# Patient Record
Sex: Male | Born: 1935 | Hispanic: No | Marital: Married | Smoking: Former smoker
Health system: Southern US, Community
[De-identification: ages and names within clinical notes are randomized; demographics above are authoritative.]

## PROBLEM LIST (undated history)

## (undated) DIAGNOSIS — Z87438 Personal history of other diseases of male genital organs: Secondary | ICD-10-CM

## (undated) DIAGNOSIS — D649 Anemia, unspecified: Secondary | ICD-10-CM

## (undated) DIAGNOSIS — I10 Essential (primary) hypertension: Secondary | ICD-10-CM

## (undated) DIAGNOSIS — K219 Gastro-esophageal reflux disease without esophagitis: Secondary | ICD-10-CM

## (undated) DIAGNOSIS — G20 Parkinson's disease: Secondary | ICD-10-CM

## (undated) DIAGNOSIS — C801 Malignant (primary) neoplasm, unspecified: Secondary | ICD-10-CM

## (undated) DIAGNOSIS — E785 Hyperlipidemia, unspecified: Secondary | ICD-10-CM

## (undated) DIAGNOSIS — K279 Peptic ulcer, site unspecified, unspecified as acute or chronic, without hemorrhage or perforation: Secondary | ICD-10-CM

## (undated) DIAGNOSIS — G20A1 Parkinson's disease without dyskinesia, without mention of fluctuations: Secondary | ICD-10-CM

## (undated) DIAGNOSIS — M1990 Unspecified osteoarthritis, unspecified site: Secondary | ICD-10-CM

## (undated) DIAGNOSIS — Z87442 Personal history of urinary calculi: Secondary | ICD-10-CM

## (undated) HISTORY — PX: EYE SURGERY: SHX253

## (undated) HISTORY — PX: APPENDECTOMY: SHX54

---

## 1997-06-22 HISTORY — PX: HERNIA REPAIR: SHX51

## 2007-11-23 ENCOUNTER — Ambulatory Visit: Payer: Self-pay | Admitting: Urology

## 2007-11-29 ENCOUNTER — Ambulatory Visit: Payer: Self-pay | Admitting: Urology

## 2007-12-01 ENCOUNTER — Ambulatory Visit: Payer: Self-pay | Admitting: Urology

## 2007-12-12 ENCOUNTER — Ambulatory Visit: Payer: Self-pay | Admitting: Urology

## 2008-01-06 ENCOUNTER — Ambulatory Visit: Payer: Self-pay | Admitting: Urology

## 2008-02-10 ENCOUNTER — Ambulatory Visit: Payer: Self-pay | Admitting: Urology

## 2008-06-26 ENCOUNTER — Ambulatory Visit: Payer: Self-pay | Admitting: Ophthalmology

## 2008-06-26 ENCOUNTER — Ambulatory Visit: Payer: Self-pay | Admitting: Internal Medicine

## 2008-07-03 ENCOUNTER — Ambulatory Visit: Payer: Self-pay | Admitting: Ophthalmology

## 2008-08-13 ENCOUNTER — Ambulatory Visit: Payer: Self-pay | Admitting: Urology

## 2008-08-14 ENCOUNTER — Ambulatory Visit: Payer: Self-pay | Admitting: Ophthalmology

## 2008-09-18 ENCOUNTER — Ambulatory Visit: Payer: Self-pay | Admitting: Urology

## 2009-03-18 ENCOUNTER — Ambulatory Visit: Payer: Self-pay | Admitting: Urology

## 2009-09-02 ENCOUNTER — Ambulatory Visit: Payer: Self-pay | Admitting: Urology

## 2010-01-27 ENCOUNTER — Ambulatory Visit: Payer: Self-pay | Admitting: Urology

## 2010-01-30 ENCOUNTER — Ambulatory Visit: Payer: Self-pay | Admitting: Urology

## 2010-02-10 ENCOUNTER — Ambulatory Visit: Payer: Self-pay | Admitting: Urology

## 2010-09-08 ENCOUNTER — Ambulatory Visit: Payer: Self-pay | Admitting: Urology

## 2011-05-01 ENCOUNTER — Ambulatory Visit: Payer: Self-pay | Admitting: Unknown Physician Specialty

## 2011-09-14 ENCOUNTER — Ambulatory Visit: Payer: Self-pay | Admitting: Urology

## 2012-09-12 ENCOUNTER — Ambulatory Visit: Payer: Self-pay | Admitting: Urology

## 2013-07-31 ENCOUNTER — Ambulatory Visit: Payer: Self-pay

## 2013-09-29 ENCOUNTER — Ambulatory Visit: Payer: Self-pay | Admitting: Urology

## 2015-08-19 ENCOUNTER — Encounter: Payer: Self-pay | Admitting: Podiatry

## 2015-08-19 ENCOUNTER — Ambulatory Visit (INDEPENDENT_AMBULATORY_CARE_PROVIDER_SITE_OTHER): Payer: Medicare Other | Admitting: Podiatry

## 2015-08-19 VITALS — BP 154/88 | HR 69 | Resp 12

## 2015-08-19 DIAGNOSIS — L600 Ingrowing nail: Secondary | ICD-10-CM

## 2015-08-19 NOTE — Progress Notes (Signed)
   Subjective:    Patient ID: Barry Pace, male    DOB: December 18, 1935, 80 y.o.   MRN: [id number]  HPI: Barry Pace presents today chief concern discoloration lateral nail border hallux left. He states has been like this for quite some time and does not seem to be growing out. He states that as a matter fact the whole toenails not growing very much.    Review of Systems  Cardiovascular: Positive for leg swelling.  Genitourinary: Positive for frequency.  Skin: Positive for color change.  Hematological: Bruises/bleeds easily.       Objective:   Physical Exam: Vital signs stable alert and oriented 3 pulses are palpable bilateral. Pulses are strongly palpable capillary fill time is normal neurologic sensorium is intact percent joint C monofilament. Deep tendon reflexes are intact bilateral muscle strength +5 over 5 dorsiflexion plantar flexors and inverters everters all to the musculature is intact. Orthopedic evaluation of his result joints distal to the ankle range of motion without crepitation. Cutaneous evaluation demonstrates supple well-hydrated cutis he does demonstrate a subungual hematoma lateral border of the hallux left. I debrided an area of the toenail today to make sure that this was not pigmentation but rather blood. It was blood and does not demonstrate any signs of melanin.        Assessment & Plan:  Subungual hematoma hallux left.  Plan: Debridement of the toenail follow up with me as needed.

## 2016-06-12 ENCOUNTER — Encounter
Admission: RE | Admit: 2016-06-12 | Discharge: 2016-06-12 | Disposition: A | Discharge status: Home / Self Care | Payer: Medicare Other | Source: Ambulatory Visit | Attending: Surgery | Admitting: Surgery

## 2016-06-12 DIAGNOSIS — Z888 Allergy status to other drugs, medicaments and biological substances status: Secondary | ICD-10-CM | POA: Diagnosis not present

## 2016-06-12 DIAGNOSIS — Z01818 Encounter for other preprocedural examination: Secondary | ICD-10-CM | POA: Insufficient documentation

## 2016-06-12 DIAGNOSIS — R9431 Abnormal electrocardiogram [ECG] [EKG]: Secondary | ICD-10-CM | POA: Diagnosis not present

## 2016-06-12 DIAGNOSIS — Z880 Allergy status to penicillin: Secondary | ICD-10-CM | POA: Insufficient documentation

## 2016-06-12 DIAGNOSIS — Z01812 Encounter for preprocedural laboratory examination: Secondary | ICD-10-CM | POA: Insufficient documentation

## 2016-06-12 DIAGNOSIS — K4090 Unilateral inguinal hernia, without obstruction or gangrene, not specified as recurrent: Secondary | ICD-10-CM | POA: Insufficient documentation

## 2016-06-12 HISTORY — DX: Malignant (primary) neoplasm, unspecified: C80.1

## 2016-06-12 HISTORY — DX: Peptic ulcer, site unspecified, unspecified as acute or chronic, without hemorrhage or perforation: K27.9

## 2016-06-12 HISTORY — DX: Personal history of other diseases of male genital organs: Z87.438

## 2016-06-12 HISTORY — DX: Unspecified osteoarthritis, unspecified site: M19.90

## 2016-06-12 HISTORY — DX: Hyperlipidemia, unspecified: E78.5

## 2016-06-12 HISTORY — DX: Gastro-esophageal reflux disease without esophagitis: K21.9

## 2016-06-12 HISTORY — DX: Personal history of urinary calculi: Z87.442

## 2016-06-12 HISTORY — DX: Anemia, unspecified: D64.9

## 2016-06-12 LAB — DIFFERENTIAL
Basophils Absolute: 0 10*3/uL (ref 0–0.1)
Basophils Relative: 0 %
Eosinophils Absolute: 0 10*3/uL (ref 0–0.7)
Eosinophils Relative: 1 %
LYMPHS ABS: 1.5 10*3/uL (ref 1.0–3.6)
LYMPHS PCT: 27 %
MONO ABS: 0.6 10*3/uL (ref 0.2–1.0)
MONOS PCT: 11 %
NEUTROS ABS: 3.4 10*3/uL (ref 1.4–6.5)
Neutrophils Relative %: 61 %

## 2016-06-12 LAB — CBC
HCT: 45.6 % (ref 40.0–52.0)
Hemoglobin: 15.9 g/dL (ref 13.0–18.0)
MCH: 33.1 pg (ref 26.0–34.0)
MCHC: 35 g/dL (ref 32.0–36.0)
MCV: 94.7 fL (ref 80.0–100.0)
PLATELETS: 214 10*3/uL (ref 150–440)
RBC: 4.82 MIL/uL (ref 4.40–5.90)
RDW: 12.6 % (ref 11.5–14.5)
WBC: 5.6 10*3/uL (ref 3.8–10.6)

## 2016-06-12 LAB — SURGICAL PCR SCREEN
MRSA, PCR: NEGATIVE
Staphylococcus aureus: NEGATIVE

## 2016-06-12 NOTE — Patient Instructions (Signed)
  Your procedure is scheduled on: June 19, 2016 (Friday) Report to Same Day Surgery 2nd floor medical mall [facility] Entrance-take elevator on left to 2nd floor.  Check in with surgery information desk.) To find out your arrival time please call [phone] between 1PM - 3PM on June 18, 2016 (Thursday)  Remember: Instructions that are not followed completely may result in serious medical risk, up to and including death, or upon the discretion of your surgeon and anesthesiologist your surgery may need to be rescheduled.    _x___ 1. Do not eat food or drink liquids after midnight. No gum chewing or hard candies.     __x__ 2. No Alcohol for 24 hours before or after surgery.   __x__3. No Smoking for 24 prior to surgery.   ____  4. Bring all medications with you on the day of surgery if instructed.    __x__ 5. Notify your doctor if there is any change in your medical condition     (cold, fever, infections).     Do not wear jewelry, make-up, hairpins, clips or nail polish.  Do not wear lotions, powders, or perfumes. You may wear deodorant.  Do not shave 48 hours prior to surgery. Men may shave face and neck.  Do not bring valuables to the hospital.    [facility] is not responsible for any belongings or valuables.               Contacts, dentures or bridgework may not be worn into surgery.  Leave your suitcase in the car. After surgery it may be brought to your room.  For patients admitted to the hospital, discharge time is determined by your treatment team.   Patients discharged the day of surgery will not be allowed to drive home.  You will need someone to drive you home and stay with you the night of your procedure.    Please read over the following fact sheets that you were given:   [facility] for [facility] and or MRSA Information   _x___ Take these medicines the morning of surgery with A SIP OF WATER:    1. Omeprazole (Omeprazole at bedtime on Thursday  night)  2.  3.  4.  5.  6.  ____Fleets enema or Magnesium Citrate as directed.   _x___ Use CHG Soap or sage wipes as directed on instruction sheet   ____ Use inhalers on the day of surgery and bring to hospital day of surgery  ____ Stop metformin 2 days prior to surgery    ____ Take 1/2 of usual insulin dose the night before surgery and none on the morning of           surgery.   __x__ Stop Aspirin, Coumadin, Pllavix ,Eliquis, Effient, or Pradaxa (NO ASPIRIN)  x__ Stop Anti-inflammatories such as Advil, Aleve, Ibuprofen, Motrin, Naproxen,          Naprosyn, Goodies powders or aspirin products. (Stop Voltaren gel, and Nabumetone now)  Ok to take Tylenol.   _x___ Stop supplements until after surgery.  (Stop Vitamin-C, L-Lysine now)   ____ Bring C-Pap to the hospital.

## 2016-06-19 ENCOUNTER — Encounter: Payer: Self-pay | Admitting: *Deleted

## 2016-06-19 ENCOUNTER — Encounter
Admission: RE | Disposition: A | Discharge status: Home / Self Care | Payer: Self-pay | Source: Ambulatory Visit | Attending: Surgery

## 2016-06-19 ENCOUNTER — Ambulatory Visit: Payer: Medicare Other | Admitting: Certified Registered"

## 2016-06-19 ENCOUNTER — Ambulatory Visit
Admission: RE | Admit: 2016-06-19 | Discharge: 2016-06-19 | Disposition: A | Discharge status: Home / Self Care | Payer: Medicare Other | Source: Ambulatory Visit | Attending: Surgery | Admitting: Surgery

## 2016-06-19 DIAGNOSIS — Z8711 Personal history of peptic ulcer disease: Secondary | ICD-10-CM | POA: Insufficient documentation

## 2016-06-19 DIAGNOSIS — E785 Hyperlipidemia, unspecified: Secondary | ICD-10-CM | POA: Diagnosis not present

## 2016-06-19 DIAGNOSIS — N400 Enlarged prostate without lower urinary tract symptoms: Secondary | ICD-10-CM | POA: Insufficient documentation

## 2016-06-19 DIAGNOSIS — M1990 Unspecified osteoarthritis, unspecified site: Secondary | ICD-10-CM | POA: Insufficient documentation

## 2016-06-19 DIAGNOSIS — K219 Gastro-esophageal reflux disease without esophagitis: Secondary | ICD-10-CM | POA: Diagnosis not present

## 2016-06-19 DIAGNOSIS — Z880 Allergy status to penicillin: Secondary | ICD-10-CM | POA: Diagnosis not present

## 2016-06-19 DIAGNOSIS — K4090 Unilateral inguinal hernia, without obstruction or gangrene, not specified as recurrent: Secondary | ICD-10-CM | POA: Insufficient documentation

## 2016-06-19 DIAGNOSIS — D649 Anemia, unspecified: Secondary | ICD-10-CM | POA: Insufficient documentation

## 2016-06-19 DIAGNOSIS — Z888 Allergy status to other drugs, medicaments and biological substances status: Secondary | ICD-10-CM | POA: Insufficient documentation

## 2016-06-19 HISTORY — PX: INGUINAL HERNIA REPAIR: SHX194

## 2016-06-19 SURGERY — REPAIR, HERNIA, INGUINAL, ADULT
Anesthesia: General | Site: Inguinal | Laterality: Left | Wound class: Clean Contaminated

## 2016-06-19 MED ORDER — ROCURONIUM BROMIDE 100 MG/10ML IV SOLN
INTRAVENOUS | Status: DC | PRN
  Administered 2016-06-19: 40 mg via INTRAVENOUS

## 2016-06-19 MED ORDER — SUGAMMADEX SODIUM 200 MG/2ML IV SOLN
INTRAVENOUS | Status: DC | PRN
  Administered 2016-06-19: 150 mg via INTRAVENOUS

## 2016-06-19 MED ORDER — HYDROCODONE-ACETAMINOPHEN 5-325 MG PO TABS
ORAL_TABLET | ORAL | Status: AC
  Filled 2016-06-19: qty 1

## 2016-06-19 MED ORDER — VANCOMYCIN HCL IN DEXTROSE 1-5 GM/200ML-% IV SOLN
INTRAVENOUS | Status: AC
  Administered 2016-06-19: 1000 mg via INTRAVENOUS
  Filled 2016-06-19: qty 200

## 2016-06-19 MED ORDER — EPINEPHRINE PF 1 MG/ML IJ SOLN
INTRAMUSCULAR | Status: AC
  Filled 2016-06-19: qty 1

## 2016-06-19 MED ORDER — ONDANSETRON HCL 4 MG/2ML IJ SOLN
4.0000 mg | Freq: Once | INTRAMUSCULAR | Status: DC | PRN
Start: 2016-06-19 — End: 2016-06-19

## 2016-06-19 MED ORDER — PHENYLEPHRINE HCL 10 MG/ML IJ SOLN
INTRAMUSCULAR | Status: DC | PRN
  Administered 2016-06-19: 100 ug via INTRAVENOUS
  Administered 2016-06-19 (×2): 200 ug via INTRAVENOUS
  Administered 2016-06-19: 100 ug via INTRAVENOUS
  Administered 2016-06-19: 200 ug via INTRAVENOUS

## 2016-06-19 MED ORDER — ROCURONIUM BROMIDE 50 MG/5ML IV SOSY
PREFILLED_SYRINGE | INTRAVENOUS | Status: AC
  Filled 2016-06-19: qty 5

## 2016-06-19 MED ORDER — BUPIVACAINE-EPINEPHRINE (PF) 0.25% -1:200000 IJ SOLN
INTRAMUSCULAR | Status: DC | PRN
  Administered 2016-06-19: 15 mL via PERINEURAL

## 2016-06-19 MED ORDER — ONDANSETRON HCL 4 MG/2ML IJ SOLN
INTRAMUSCULAR | Status: DC | PRN
  Administered 2016-06-19: 4 mg via INTRAVENOUS

## 2016-06-19 MED ORDER — HYDROCODONE-ACETAMINOPHEN 5-325 MG PO TABS
1.0000 | ORAL_TABLET | ORAL | Status: DC | PRN
  Administered 2016-06-19: 1 via ORAL

## 2016-06-19 MED ORDER — SUGAMMADEX SODIUM 200 MG/2ML IV SOLN
INTRAVENOUS | Status: AC
  Filled 2016-06-19: qty 2

## 2016-06-19 MED ORDER — DEXAMETHASONE SODIUM PHOSPHATE 10 MG/ML IJ SOLN
INTRAMUSCULAR | Status: DC | PRN
  Administered 2016-06-19: 10 mg via INTRAVENOUS

## 2016-06-19 MED ORDER — PROPOFOL 10 MG/ML IV BOLUS
INTRAVENOUS | Status: AC
  Filled 2016-06-19: qty 20

## 2016-06-19 MED ORDER — FENTANYL CITRATE (PF) 100 MCG/2ML IJ SOLN
INTRAMUSCULAR | Status: DC | PRN
  Administered 2016-06-19 (×2): 25 ug via INTRAVENOUS

## 2016-06-19 MED ORDER — FENTANYL CITRATE (PF) 100 MCG/2ML IJ SOLN
25.0000 ug | INTRAMUSCULAR | Status: DC | PRN
  Administered 2016-06-19 (×2): 25 ug via INTRAVENOUS

## 2016-06-19 MED ORDER — VANCOMYCIN HCL IN DEXTROSE 1-5 GM/200ML-% IV SOLN
1000.0000 mg | Freq: Once | INTRAVENOUS | Status: AC
  Administered 2016-06-19: 1000 mg via INTRAVENOUS

## 2016-06-19 MED ORDER — MIDAZOLAM HCL 2 MG/2ML IJ SOLN
INTRAMUSCULAR | Status: DC | PRN
  Administered 2016-06-19: 1 mg via INTRAVENOUS

## 2016-06-19 MED ORDER — PROPOFOL 10 MG/ML IV BOLUS
INTRAVENOUS | Status: DC | PRN
  Administered 2016-06-19: 100 mg via INTRAVENOUS
  Administered 2016-06-19: 40 mg via INTRAVENOUS

## 2016-06-19 MED ORDER — BUPIVACAINE HCL (PF) 0.5 % IJ SOLN
INTRAMUSCULAR | Status: AC
  Filled 2016-06-19: qty 30

## 2016-06-19 MED ORDER — FENTANYL CITRATE (PF) 100 MCG/2ML IJ SOLN
INTRAMUSCULAR | Status: AC
Start: 2016-06-19 — End: 2016-06-19
  Filled 2016-06-19: qty 2

## 2016-06-19 MED ORDER — ONDANSETRON HCL 4 MG/2ML IJ SOLN
INTRAMUSCULAR | Status: AC
  Filled 2016-06-19: qty 2

## 2016-06-19 MED ORDER — HYDROCODONE-ACETAMINOPHEN 5-325 MG PO TABS: 1.0000 | ORAL_TABLET | ORAL | 0 refills | Status: DC | PRN

## 2016-06-19 MED ORDER — LACTATED RINGERS IV SOLN
INTRAVENOUS | Status: DC
  Administered 2016-06-19 (×2): via INTRAVENOUS

## 2016-06-19 MED ORDER — PHENYLEPHRINE HCL 10 MG/ML IJ SOLN
INTRAMUSCULAR | Status: AC
  Filled 2016-06-19: qty 1

## 2016-06-19 MED ORDER — LIDOCAINE HCL (CARDIAC) 20 MG/ML IV SOLN
INTRAVENOUS | Status: DC | PRN
  Administered 2016-06-19: 100 mg via INTRAVENOUS

## 2016-06-19 MED ORDER — DEXAMETHASONE SODIUM PHOSPHATE 10 MG/ML IJ SOLN
INTRAMUSCULAR | Status: AC
  Filled 2016-06-19: qty 1

## 2016-06-19 MED ORDER — FENTANYL CITRATE (PF) 100 MCG/2ML IJ SOLN
INTRAMUSCULAR | Status: AC
  Filled 2016-06-19: qty 2

## 2016-06-19 MED ORDER — MIDAZOLAM HCL 2 MG/2ML IJ SOLN
INTRAMUSCULAR | Status: AC
  Filled 2016-06-19: qty 2

## 2016-06-19 MED ORDER — LIDOCAINE 2% (20 MG/ML) 5 ML SYRINGE
INTRAMUSCULAR | Status: AC
  Filled 2016-06-19: qty 5

## 2016-06-19 MED ORDER — BUPIVACAINE-EPINEPHRINE (PF) 0.25% -1:200000 IJ SOLN
INTRAMUSCULAR | Status: AC
  Filled 2016-06-19: qty 30

## 2016-06-19 SURGICAL SUPPLY — 30 items
BLADE CLIPPER SURG (BLADE) ×3 IMPLANT
BLADE SURG 15 STRL LF DISP TIS (BLADE) ×1 IMPLANT
BLADE SURG 15 STRL SS (BLADE) ×2
CANISTER SUCT 1200ML W/VALVE (MISCELLANEOUS) ×3 IMPLANT
CHLORAPREP W/TINT 26ML (MISCELLANEOUS) ×3 IMPLANT
DERMABOND ADVANCED (GAUZE/BANDAGES/DRESSINGS) ×2
DERMABOND ADVANCED .7 DNX12 (GAUZE/BANDAGES/DRESSINGS) ×1 IMPLANT
DRAIN PENROSE 5/8X18 LTX STRL (WOUND CARE) ×3 IMPLANT
DRAPE LAPAROTOMY 77X122 PED (DRAPES) ×3 IMPLANT
ELECT REM PT RETURN 9FT ADLT (ELECTROSURGICAL) ×3
ELECTRODE REM PT RTRN 9FT ADLT (ELECTROSURGICAL) ×1 IMPLANT
GLOVE BIO SURGEON STRL SZ7.5 (GLOVE) ×6 IMPLANT
GLOVE BIOGEL PI IND STRL 6.5 (GLOVE) ×1 IMPLANT
GLOVE BIOGEL PI INDICATOR 6.5 (GLOVE) ×2
GLOVE SURG SYN 6.5 ES PF (GLOVE) ×3 IMPLANT
GOWN STRL REUS W/ TWL LRG LVL3 (GOWN DISPOSABLE) ×3 IMPLANT
GOWN STRL REUS W/TWL LRG LVL3 (GOWN DISPOSABLE) ×6
KIT RM TURNOVER STRD PROC AR (KITS) ×3 IMPLANT
LABEL OR SOLS (LABEL) IMPLANT
MESH SYNTHETIC 4X6 SOFT BARD (Mesh General) ×1 IMPLANT
MESH SYNTHETIC SOFT BARD 4X6 (Mesh General) ×2 IMPLANT
NEEDLE HYPO 25X1 1.5 SAFETY (NEEDLE) ×3 IMPLANT
NS IRRIG 500ML POUR BTL (IV SOLUTION) ×3 IMPLANT
PACK BASIN MINOR ARMC (MISCELLANEOUS) ×3 IMPLANT
SUT CHROMIC 4 0 RB 1X27 (SUTURE) ×3 IMPLANT
SUT MNCRL AB 4-0 PS2 18 (SUTURE) ×3 IMPLANT
SUT SURGILON 0 30 BLK (SUTURE) ×9 IMPLANT
SUT VIC AB 4-0 SH 27 (SUTURE) ×2
SUT VIC AB 4-0 SH 27XANBCTRL (SUTURE) ×1 IMPLANT
SYRINGE 10CC LL (SYRINGE) ×3 IMPLANT

## 2016-06-19 NOTE — Anesthesia Preprocedure Evaluation (Addendum)
Anesthesia Evaluation  Patient identified by MRN, date of birth, ID band Patient awake    Reviewed: Allergy & Precautions, NPO status , Patient's Chart, lab work & pertinent test results  Airway Mallampati: II  TM Distance: >3 FB     Dental  (+) Chipped   Pulmonary neg pulmonary ROS,    Pulmonary exam normal        Cardiovascular negative cardio ROS Normal cardiovascular exam     Neuro/Psych negative neurological ROS  negative psych ROS   GI/Hepatic Neg liver ROS, PUD, GERD  Medicated and Controlled,  Endo/Other    Renal/GU stones  negative genitourinary   Musculoskeletal  (+) Arthritis , Osteoarthritis,    Abdominal Normal abdominal exam  (+)   Peds negative pediatric ROS (+)  Hematology  (+) anemia ,   Anesthesia Other Findings   Reproductive/Obstetrics                            Anesthesia Physical Anesthesia Plan  ASA: II  Anesthesia Plan: General   Post-op Pain Management:    Induction: Intravenous  Airway Management Planned: Oral ETT  Additional Equipment:   Intra-op Plan:   Post-operative Plan: Extubation in OR  Informed Consent: I have reviewed the patients History and Physical, chart, labs and discussed the procedure including the risks, benefits and alternatives for the proposed anesthesia with the patient or authorized representative who has indicated his/her understanding and acceptance.   Dental advisory given  Plan Discussed with: CRNA and Surgeon  Anesthesia Plan Comments:         Anesthesia Quick Evaluation

## 2016-06-19 NOTE — Op Note (Signed)
OPERATIVE REPORT  PREOPERATIVE DIAGNOSIS: left inguinal hernia  POSTOPERATIVE DIAGNOSIS:left  inguinal hernia  PROCEDURE:  left inguinal hernia repair  ANESTHESIA:  General  SURGEON:  Rochel Brome M.D.  INDICATIONS: He has had recent bulging in the left groin and a left and the hernia was demonstrated on physical exam. Repair is recommended for definitive treatment  With the patient on the operating table in the supine position the left lower quadrant was prepared with clippers and with ChloraPrep and draped in a sterile manner. A transversely oriented suprapubic incision was made and carried down through subcutaneous tissues. Electrocautery was used for hemostasis. The Scarpa's fascia was incised. The external oblique aponeurosis was incised along the course of its fibers to open the external ring and expose the inguinal cord structures. The cord structures were mobilized. A Penrose drain was passed around the cord structures for traction. Cremaster fibers were separated to examine the cord structures and salt no indirect hernia. There was a large direct inguinal hernia in the floor of the inguinal canal. The attenuated transversalis fascia was incised circumferentially and removed. The sac some 5 cm in length was inverted. The repair was carried out with interrupted 0 Surgilon sutures suturing the conjoined tendon to the shelving edge of the inguinal ligament incorporating transversalis fascia into the repair.Bard soft mesh was cut to create an oval shape and was placed over the repair. This was sutured to the repair with interrupted 0 Surgilon sutures and also sutured medially to the deep fascia and on both sides of the internal ring. Next after seeing hemostasis was intact the cord structures were replaced along the floor of the inguinal canal. The cut edges of the external oblique aponeurosis were closed with a running 4-0 Vicryl suture to re-create the external ring. The deep fascia superior and  lateral to the repair site was infiltrated with quarter percent Sensorcaine with epinephrine. Subcutaneous tissues were also infiltrated. The Scarpa's fascia was closed with interrupted 4-0 Vicryl sutures. The skin was closed with running 4-0 Monocryl subcuticular suture and LiquiBand. The testicle remained in the scrotum  The patient appeared to be in satisfactory condition and was prepared for transfer to the recovery room.  Rochel Brome M.D.

## 2016-06-19 NOTE — Anesthesia Procedure Notes (Signed)
Procedure Name: Intubation Date/Time: 06/19/2016 1:03 PM Performed by: Silvana Newness Pre-anesthesia Checklist: Patient identified, Emergency Drugs available, Suction available, Patient being monitored and Timeout performed Patient Re-evaluated:Patient Re-evaluated prior to inductionOxygen Delivery Method: Circle system utilized Preoxygenation: Pre-oxygenation with 100% oxygen Intubation Type: IV induction Ventilation: Mask ventilation without difficulty Laryngoscope Size: Mac and 4 Grade View: Grade I Tube type: Oral Tube size: 7.5 mm Number of attempts: 1 Airway Equipment and Method: Stylet Placement Confirmation: ETT inserted through vocal cords under direct vision,  positive ETCO2 and breath sounds checked- equal and bilateral Secured at: 22 cm Tube secured with: Tape Dental Injury: Teeth and Oropharynx as per pre-operative assessment

## 2016-06-19 NOTE — H&P (Signed)
  He reports no change in condition since the day of the office exam.  Lab work was noted.  The left side was marked YES. I discussed the plan for left inguinal hernia repair

## 2016-06-19 NOTE — Discharge Instructions (Addendum)
Take Tylenol or Norco if needed for pain. ° °Should not drive or do anything dangerous when taking Norco. ° °May shower and blot dry. ° °Avoid straining and heavy lifting. ° °AMBULATORY SURGERY  °DISCHARGE INSTRUCTIONS ° ° °1) The drugs that you were given will stay in your system until tomorrow so for the next 24 hours you should not: ° °A) Drive an automobile °B) Make any legal decisions °C) Drink any alcoholic beverage ° ° °2) You may resume regular meals tomorrow.  Today it is better to start with liquids and gradually work up to solid foods. ° °You may eat anything you prefer, but it is better to start with liquids, then soup and crackers, and gradually work up to solid foods. ° ° °3) Please notify your doctor immediately if you have any unusual bleeding, trouble breathing, redness and pain at the surgery site, drainage, fever, or pain not relieved by medication. ° °4) Additional Instructions: ° ° °Please contact your physician with any problems or Same Day Surgery at [phone], Monday through Friday 6 am to 4 pm, or [place] [facility] number at [phone]. °

## 2016-06-19 NOTE — Transfer of Care (Signed)
Immediate Anesthesia Transfer of Care Note  Patient: Barry Pace  Procedure(s) Performed: Procedure(s): HERNIA REPAIR INGUINAL ADULT (Left)  Patient Location: PACU  Anesthesia Type:General  Level of Consciousness: awake, oriented and patient cooperative  Airway & Oxygen Therapy: Patient Spontanous Breathing and Patient connected to face mask oxygen  Post-op Assessment: Report given to RN, Post -op Vital signs reviewed and stable and Patient moving all extremities X 4  Post vital signs: Reviewed and stable  Last Vitals:  Vitals:   06/19/16 1048  BP: (!) 179/103  Pulse: 88  Resp: 20  Temp: 36.2 C    Last Pain:  Vitals:   06/19/16 1048  TempSrc: Tympanic         Complications: No apparent anesthesia complications

## 2016-06-21 NOTE — Anesthesia Postprocedure Evaluation (Signed)
Anesthesia Post Note  Patient: Barry Pace  Procedure(s) Performed: Procedure(s) (LRB): HERNIA REPAIR INGUINAL ADULT (Left)  Patient location during evaluation: PACU Anesthesia Type: General Level of consciousness: awake and alert and oriented Pain management: pain level controlled Vital Signs Assessment: post-procedure vital signs reviewed and stable Respiratory status: spontaneous breathing Cardiovascular status: blood pressure returned to baseline Anesthetic complications: no     Last Vitals:  Vitals:   06/19/16 1521 06/19/16 1725  BP: (!) 165/86 (!) 151/85  Pulse: 89 78  Resp: 16 16  Temp:  36.8 C    Last Pain:  Vitals:   06/19/16 1725  TempSrc: Oral  PainSc: 0-No pain                 P[place]idon Pam

## 2016-06-23 ENCOUNTER — Encounter: Payer: Self-pay | Admitting: Surgery

## 2016-07-06 ENCOUNTER — Emergency Department
Admission: EM | Admit: 2016-07-06 | Discharge: 2016-07-06 | Disposition: A | Discharge status: Home / Self Care | Payer: Medicare Other | Attending: Emergency Medicine | Admitting: Emergency Medicine

## 2016-07-06 ENCOUNTER — Encounter: Payer: Self-pay | Admitting: *Deleted

## 2016-07-06 ENCOUNTER — Emergency Department: Payer: Medicare Other

## 2016-07-06 DIAGNOSIS — Y999 Unspecified external cause status: Secondary | ICD-10-CM | POA: Insufficient documentation

## 2016-07-06 DIAGNOSIS — Y92009 Unspecified place in unspecified non-institutional (private) residence as the place of occurrence of the external cause: Secondary | ICD-10-CM | POA: Diagnosis not present

## 2016-07-06 DIAGNOSIS — S0993XA Unspecified injury of face, initial encounter: Secondary | ICD-10-CM | POA: Diagnosis present

## 2016-07-06 DIAGNOSIS — S0083XA Contusion of other part of head, initial encounter: Secondary | ICD-10-CM

## 2016-07-06 DIAGNOSIS — W19XXXA Unspecified fall, initial encounter: Secondary | ICD-10-CM

## 2016-07-06 DIAGNOSIS — S0181XA Laceration without foreign body of other part of head, initial encounter: Secondary | ICD-10-CM | POA: Insufficient documentation

## 2016-07-06 DIAGNOSIS — Y9301 Activity, walking, marching and hiking: Secondary | ICD-10-CM | POA: Diagnosis not present

## 2016-07-06 DIAGNOSIS — S01111A Laceration without foreign body of right eyelid and periocular area, initial encounter: Secondary | ICD-10-CM | POA: Diagnosis not present

## 2016-07-06 DIAGNOSIS — T148XXA Other injury of unspecified body region, initial encounter: Secondary | ICD-10-CM

## 2016-07-06 DIAGNOSIS — W1839XA Other fall on same level, initial encounter: Secondary | ICD-10-CM | POA: Diagnosis not present

## 2016-07-06 MED ORDER — LIDOCAINE HCL (PF) 1 % IJ SOLN
5.0000 mL | Freq: Once | INTRAMUSCULAR | Status: DC
  Filled 2016-07-06: qty 5

## 2016-07-06 MED ORDER — BACITRACIN ZINC 500 UNIT/GM EX OINT
TOPICAL_OINTMENT | Freq: Once | CUTANEOUS | Status: AC
  Administered 2016-07-06: 1 via TOPICAL
  Filled 2016-07-06: qty 0.9

## 2016-07-06 MED ORDER — LIDOCAINE-EPINEPHRINE-TETRACAINE (LET) SOLUTION
3.0000 mL | Freq: Once | NASAL | Status: AC
  Administered 2016-07-06: 20:00:00 3 mL via TOPICAL
  Filled 2016-07-06: qty 3

## 2016-07-06 MED ORDER — BACITRACIN ZINC 500 UNIT/GM EX OINT
TOPICAL_OINTMENT | CUTANEOUS | Status: AC
  Filled 2016-07-06: qty 3.6

## 2016-07-06 NOTE — ED Notes (Signed)
See triage note   States he tripped from curb  Landed face first. Has multiple abrasions to forehead,chin  And hand  Denies any LOC

## 2016-07-06 NOTE — ED Triage Notes (Signed)
Pt tripped off curb at PCP office (there for hernia follow up) and arrives with abrasions to forehead, chin, and right hand, denies any use of blood thinners

## 2016-07-06 NOTE — Discharge Instructions (Signed)
Your exam, CT scan, and x-ray are normal after your fall. Keep the abrasions clean and covered with antibiotic ointment. See your provider in 3-5 days for suture removal. Follow-up with your surgeon for any concerns to your surgical site.

## 2016-07-06 NOTE — ED Provider Notes (Signed)
[facility] Emergency Department Provider Note ____________________________________________  Time seen: 1745  I have reviewed the triage vital signs and the nursing notes.  HISTORY  Chief Complaint  Fall  HPI Barry Pace is a 81 y.o. male presents to the ED for evaluation of injuries sustained after a fall walking into the house. He reports missing the curb and falling forward onto his face. He sustained abrasions, lacerations and bruising to the right side of the face and chin. He also has some pain to the right middle finger and abrasions to the right knee. He had just left the surgeon's office for a 2-week post op check of a RIH repair. He denies any LOC, nausea, vomiting, vision change, or abdominal pain. He reports no wound dehiscence.   Past Medical History:  Diagnosis Date  . Anemia   . Arthritis   . Cancer (HCC)    Pre-cancerous skin cells  . GERD (gastroesophageal reflux disease)   . History of BPH   . History of kidney stones   . Hyperlipidemia   . PUD (peptic ulcer disease)     There are no active problems to display for this patient.   Past Surgical History:  Procedure Laterality Date  . APPENDECTOMY    . EYE SURGERY Bilateral    Cataract Extraction with IOL  . HERNIA REPAIR Right 1999   Inguinal Hernia Repair  . INGUINAL HERNIA REPAIR Left 06/19/2016   Procedure: HERNIA REPAIR INGUINAL ADULT;  Surgeon: Leonie Green, MD;  Location: [facility] ORS;  Service: General;  Laterality: Left;    Prior to Admission medications   Medication Sig Start Date End Date Taking? Authorizing Provider  carboxymethylcellulose 1 % ophthalmic solution Apply 1 drop to eye 3 (three) times daily as needed (for dry/itchy eyes). Thera Tears.    Historical Provider, MD  desonide (DESOWEN) 0.05 % cream Apply 1 application topically 2 (two) times daily as needed (for dry/scaly areas in scalp).     Historical Provider, MD  fluticasone (FLONASE) 50 MCG/ACT nasal  spray Place 1 spray into both nostrils 2 (two) times daily as needed (for cold/allergy symptoms.).    Historical Provider, MD  folic acid (FOLVITE) A999333 MCG tablet Take 400 mcg by mouth daily.     Historical Provider, MD  HYDROcodone-acetaminophen (NORCO) 5-325 MG tablet Take 1-2 tablets by mouth every 4 (four) hours as needed for moderate pain. 06/19/16   Leonie Green, MD  L-Lysine 500 MG CAPS Take 500 mg by mouth daily as needed (for fever blisters/cold sores.).    Historical Provider, MD  loratadine (CLARITIN) 10 MG tablet Take 10 mg by mouth daily as needed (for upper respiratory issues).    Historical Provider, MD  nabumetone (RELAFEN) 500 MG tablet Take 500 mg by mouth 2 (two) times daily.  05/31/15   Historical Provider, MD  omeprazole (PRILOSEC) 20 MG capsule Take 20 mg by mouth daily before breakfast.  03/28/15   Historical Provider, MD  potassium citrate (UROCIT-K) 10 MEQ (1080 MG) SR tablet Take 10 mEq by mouth 2 (two) times daily.     Historical Provider, MD  vitamin C (ASCORBIC ACID) 500 MG tablet Take 500 mg by mouth daily.     Historical Provider, MD  VOLTAREN 1 % GEL Apply 1 application topically 4 (four) times daily as needed. For pain/inflammation 05/21/16   Historical Provider, MD    Allergies Atorvastatin; Escitalopram; Ezetimibe-simvastatin; Penicillins; and Rosuvastatin  History reviewed. No pertinent family history.  Social History  Social History  Substance Use Topics  . Smoking status: Never Smoker  . Smokeless tobacco: Never Used  . Alcohol use 0.0 oz/week     Comment: occassional    Review of Systems  Constitutional: Negative for fever. Eyes: Negative for visual changes. ENT: Negative for sore throat. Cardiovascular: Negative for chest pain. Respiratory: Negative for shortness of breath. Gastrointestinal: Negative for abdominal pain, vomiting and diarrhea. Genitourinary: Negative for dysuria. Musculoskeletal: Negative for back pain. Skin: Negative for  rash. Neurological: Negative for headaches, focal weakness or numbness. ____________________________________________  PHYSICAL EXAM:  VITAL SIGNS: ED Triage Vitals [07/06/16 1716]  Enc Vitals Group     BP (!) 171/94     Pulse Rate 84     Resp 18     Temp 98 F (36.7 C)     Temp Source Oral     SpO2 97 %     Weight 171 lb (77.6 kg)     Height 5\' 9"  (1.753 m)     Head Circumference      Peak Flow      Pain Score 0     Pain Loc      Pain Edu?      Excl. in [place]?     Constitutional: Alert and oriented. Well appearing and in no distress. Head: Normocephalic and atraumatic, except for superficial abrasions to the nose bridge, right side of the forehead, cheek, and chin. Laceration to the right brow medially. Linear laceration to the right zygomatic cheek.  Eyes: Conjunctivae are normal. PERRL. Normal extraocular movements Ears: Canals clear. TMs intact bilaterally. Nose: No congestion/rhinorrhea/epistaxis. Mouth/Throat: Mucous membranes are moist. No dental injury Neck: Supple. No thyromegaly. Cardiovascular: Normal rate, regular rhythm. Normal distal pulses. Respiratory: Normal respiratory effort. No wheezes/rales/rhonchi. Gastrointestinal: Soft and nontender. No distention. Musculoskeletal: right hand with normal composite fist. Right middle finger with DIP tenderness without dislocation, edema, or deformity. Nontender with normal range of motion in all extremities.  Neurologic:  Normal gait without ataxia. Normal speech and language. No gross focal neurologic deficits are appreciated. Skin:  Skin is warm, dry and intact. No rash noted. RIH surgical wound site without erythema, induration, or dehiscence.  Psychiatric: Mood and affect are normal. Patient exhibits appropriate insight and judgment. ____________________________________________   RADIOLOGY  CT Maxilofacial  IMPRESSION: No acute osseous abnormality. Clear paranasal sinuses and mastoids. Soft tissue abrasions of  the right cheek.  Right Middle Finger  IMPRESSION: No acute osseous abnormality. Dorsal spurring off the dorsum of the third distal phalanx. Degenerative joint space narrowing of the PIP and DIP joints of the third and included fourth digits.  I, Aneeka Bowden, Dannielle Karvonen, personally viewed and evaluated these images (plain radiographs) as part of my medical decision making, as well as reviewing the written report by the radiologist. ____________________________________________  PROCEDURES Bacitracin ointment to facial abrasions  LACERATION REPAIR Performed by: Melvenia Needles Authorized by: Melvenia Needles Consent: Verbal consent obtained. Risks and benefits: risks, benefits and alternatives were discussed Consent given by: patient Patient identity confirmed: provided demographic data Prepped and Draped in normal sterile fashion Wound explored  Laceration Location: right brow & cheek  Laceration Length: brow 1 cm &  Cheek 1.5 cm   No Foreign Bodies seen or palpated  Anesthesia: topical infiltration  Local anesthetic: lidocaine-epinephrine-tetracaine  Anesthetic total: 3 ml  Irrigation method: syringe Amount of cleaning: standard  Skin closure: 5-0 nylon  Number of sutures: brow - #2 & cheek - #3  Technique:  interrupted  Patient tolerance: Patient tolerated the procedure well with no immediate complications. ____________________________________________  INITIAL IMPRESSION / ASSESSMENT AND PLAN / ED COURSE  Patient with facial abrasions and lacerations s/p a mechanical fall. Facial lacerations s/p suture repair. Patient's CT and x-ray negative for acute fracture or dislocation. Patient is discharged to follow-up with his primary care provider in 3-5 days for suture removal. He will follow-up with his surgeon for any surgery site concerns.   Clinical Course    ____________________________________________  FINAL CLINICAL IMPRESSION(S) / ED  DIAGNOSES  Final diagnoses:  Fall, initial encounter  Contusion of face, initial encounter  Abrasion  Facial laceration, initial encounter     Melvenia Needles, PA-C 07/06/16 2110    Harvest Dark, MD 07/06/16 2257

## 2017-09-20 ENCOUNTER — Telehealth: Payer: Self-pay | Admitting: Urology

## 2017-09-20 NOTE — Telephone Encounter (Signed)
Pt is a transfer from [facility] w/[place] and needs his preferred pharmacy updated to [facility] on [address].

## 2017-09-23 NOTE — Telephone Encounter (Signed)
Pharmacy updated.

## 2017-10-18 ENCOUNTER — Ambulatory Visit: Payer: Self-pay | Admitting: Urology

## 2017-10-19 ENCOUNTER — Ambulatory Visit: Payer: Self-pay | Admitting: Urology

## 2017-11-02 ENCOUNTER — Emergency Department
Admission: EM | Admit: 2017-11-02 | Discharge: 2017-11-02 | Disposition: A | Discharge status: Home / Self Care | Payer: Medicare Other | Attending: Emergency Medicine | Admitting: Emergency Medicine

## 2017-11-02 ENCOUNTER — Telehealth: Payer: Self-pay

## 2017-11-02 ENCOUNTER — Emergency Department: Payer: Medicare Other

## 2017-11-02 ENCOUNTER — Other Ambulatory Visit: Payer: Self-pay

## 2017-11-02 ENCOUNTER — Encounter: Payer: Self-pay | Admitting: Urology

## 2017-11-02 ENCOUNTER — Encounter: Payer: Self-pay | Admitting: Emergency Medicine

## 2017-11-02 ENCOUNTER — Ambulatory Visit (INDEPENDENT_AMBULATORY_CARE_PROVIDER_SITE_OTHER): Payer: Medicare Other | Admitting: Urology

## 2017-11-02 VITALS — BP 190/95 | HR 85 | Resp 16 | Ht 69.0 in | Wt 169.8 lb

## 2017-11-02 DIAGNOSIS — N200 Calculus of kidney: Secondary | ICD-10-CM

## 2017-11-02 DIAGNOSIS — R0602 Shortness of breath: Secondary | ICD-10-CM | POA: Insufficient documentation

## 2017-11-02 DIAGNOSIS — I2510 Atherosclerotic heart disease of native coronary artery without angina pectoris: Secondary | ICD-10-CM

## 2017-11-02 DIAGNOSIS — Z79899 Other long term (current) drug therapy: Secondary | ICD-10-CM | POA: Insufficient documentation

## 2017-11-02 DIAGNOSIS — R55 Syncope and collapse: Secondary | ICD-10-CM | POA: Diagnosis present

## 2017-11-02 DIAGNOSIS — R109 Unspecified abdominal pain: Secondary | ICD-10-CM | POA: Diagnosis not present

## 2017-11-02 LAB — URINALYSIS, COMPLETE (UACMP) WITH MICROSCOPIC
Bacteria, UA: NONE SEEN
Bilirubin Urine: NEGATIVE
GLUCOSE, UA: NEGATIVE mg/dL
Hgb urine dipstick: NEGATIVE
Ketones, ur: NEGATIVE mg/dL
Leukocytes, UA: NEGATIVE
NITRITE: NEGATIVE
Protein, ur: NEGATIVE mg/dL
Specific Gravity, Urine: 1.013 (ref 1.005–1.030)
pH: 8 (ref 5.0–8.0)

## 2017-11-02 LAB — CBC
HCT: 43.8 % (ref 40.0–52.0)
HEMOGLOBIN: 15.6 g/dL (ref 13.0–18.0)
MCH: 34.1 pg — AB (ref 26.0–34.0)
MCHC: 35.7 g/dL (ref 32.0–36.0)
MCV: 95.6 fL (ref 80.0–100.0)
Platelets: 238 10*3/uL (ref 150–440)
RBC: 4.58 MIL/uL (ref 4.40–5.90)
RDW: 13 % (ref 11.5–14.5)
WBC: 6.6 10*3/uL (ref 3.8–10.6)

## 2017-11-02 LAB — BASIC METABOLIC PANEL
Anion gap: 10 (ref 5–15)
BUN: 21 mg/dL — AB (ref 6–20)
CALCIUM: 9.2 mg/dL (ref 8.9–10.3)
CO2: 24 mmol/L (ref 22–32)
CREATININE: 1.52 mg/dL — AB (ref 0.61–1.24)
Chloride: 104 mmol/L (ref 101–111)
GFR calc Af Amer: 48 mL/min — ABNORMAL LOW (ref >60)
GFR, EST NON AFRICAN AMERICAN: 42 mL/min — AB (ref >60)
GLUCOSE: 122 mg/dL — AB (ref 65–99)
Potassium: 3.8 mmol/L (ref 3.5–5.1)
Sodium: 138 mmol/L (ref 135–145)

## 2017-11-02 LAB — TROPONIN I
Troponin I: <0.03 ng/mL (ref <0.03)
Troponin I: <0.03 ng/mL (ref <0.03)

## 2017-11-02 MED ORDER — ONDANSETRON HCL 4 MG/2ML IJ SOLN
4.0000 mg | Freq: Once | INTRAMUSCULAR | Status: AC
  Administered 2017-11-02: 4 mg via INTRAVENOUS
  Filled 2017-11-02: qty 2

## 2017-11-02 MED ORDER — IOPAMIDOL (ISOVUE-370) INJECTION 76%
75.0000 mL | Freq: Once | INTRAVENOUS | Status: AC | PRN
  Administered 2017-11-02: 75 mL via INTRAVENOUS

## 2017-11-02 MED ORDER — SODIUM CHLORIDE 0.9 % IV BOLUS
1000.0000 mL | Freq: Once | INTRAVENOUS | Status: AC
  Administered 2017-11-02: 1000 mL via INTRAVENOUS

## 2017-11-02 NOTE — Telephone Encounter (Signed)
Called pt's wife, Enid Derry listed as emergency contact. Advised pt's wife that the patient was being transported by [place] to the ED as he was experiencing elevated BPs, nausea and dizziness. Pt's wife gave verbal understanding.

## 2017-11-02 NOTE — Discharge Instructions (Addendum)
Your tests today overall were unremarkable and did not reveal a cause for your symptoms today.  There is evidence of extensive coronary artery disease, and you should follow up with cardiology for further evaluation of this finding.

## 2017-11-02 NOTE — ED Notes (Signed)
Report given to Andrea RN

## 2017-11-02 NOTE — ED Notes (Signed)
MRI called to say they are coming to get pt for his scan.

## 2017-11-02 NOTE — ED Notes (Signed)
Patient transported to CT 

## 2017-11-02 NOTE — ED Notes (Signed)
The EKG was completed and signed by Dr. Joni Fears. The EKG was also exported into the system.

## 2017-11-02 NOTE — ED Notes (Signed)
Pt returned from MRI and placed on monitor. Alert and talkative with no distress noted. Family at the bedside.

## 2017-11-02 NOTE — ED Provider Notes (Signed)
[facility] Emergency Department Provider Note  ____________________________________________  Time seen: Approximately 5:18 PM  I have reviewed the triage vital signs and the nursing notes.   HISTORY  Chief Complaint Near Syncope and Shortness of Breath    HPI Barry Pace is a 82 y.o. male who complains of sudden onset of dizziness described as room spinning, starting at about 3:30 PM today while the patient was in the urology clinic waiting to see his doctor.. Associated with nausea and mild bilateral frontal headache and shortness of breath that all started at the same time. Constant, severe. Worse with turning head or moving eyes. No alleviating factors. Denies chest pain abdominal pain or back pain. Symptoms are still present at this time. Headache is dull and nonradiating.  He reports having a history of intermittent vertiginous symptoms over the past 2 weeks but otherwise no prior symptoms like today. No known history of aneurysms.      Past Medical History:  Diagnosis Date  . Anemia   . Arthritis   . Cancer (HCC)    Pre-cancerous skin cells  . GERD (gastroesophageal reflux disease)   . History of BPH   . History of kidney stones   . Hyperlipidemia   . PUD (peptic ulcer disease)      There are no active problems to display for this patient.    Past Surgical History:  Procedure Laterality Date  . APPENDECTOMY    . EYE SURGERY Bilateral    Cataract Extraction with IOL  . HERNIA REPAIR Right 1999   Inguinal Hernia Repair  . INGUINAL HERNIA REPAIR Left 06/19/2016   Procedure: HERNIA REPAIR INGUINAL ADULT;  Surgeon: Leonie Green, MD;  Location: [facility] ORS;  Service: General;  Laterality: Left;     Prior to Admission medications   Medication Sig Start Date End Date Taking? Authorizing Provider  carboxymethylcellulose 1 % ophthalmic solution Apply 1 drop to eye 3 (three) times daily as needed (for dry/itchy eyes). Thera Tears.    Yes [provider]  desonide (DESOWEN) 0.05 % cream Apply 1 application topically 2 (two) times daily as needed (for dry/scaly areas in scalp).    Yes [provider]  fluticasone (FLONASE) 50 MCG/ACT nasal spray Place 1 spray into both nostrils 2 (two) times daily as needed (for cold/allergy symptoms.).   Yes [provider]  folic acid (FOLVITE) 595 MCG tablet Take 400 mcg by mouth daily.    Yes [provider]  L-Lysine 500 MG CAPS Take 500 mg by mouth daily as needed (for fever blisters/cold sores.).   Yes [provider]  loratadine (CLARITIN) 10 MG tablet Take 10 mg by mouth daily as needed (for upper respiratory issues).   Yes [provider]  meloxicam (MOBIC) 7.5 MG tablet Take 7.5 mg by mouth daily.   Yes [provider]  nabumetone (RELAFEN) 500 MG tablet Take 500 mg by mouth 2 (two) times daily.  05/31/15  Yes [provider]  omeprazole (PRILOSEC) 20 MG capsule Take 20 mg by mouth daily before breakfast.  03/28/15  Yes [provider]  potassium citrate (UROCIT-K) 10 MEQ (1080 MG) SR tablet Take 10 mEq by mouth 2 (two) times daily.    Yes [provider]  vitamin C (ASCORBIC ACID) 500 MG tablet Take 500 mg by mouth daily.    Yes [provider]  VOLTAREN 1 % GEL Apply 1 application topically 4 (four) times daily as needed. For pain/inflammation 05/21/16  Yes [provider]     Allergies Atorvastatin; Escitalopram; Ezetimibe-simvastatin; Penicillins; and Rosuvastatin   History reviewed. No pertinent family history.  Social History Social History   Tobacco Use  . Smoking status: Never Smoker  . Smokeless tobacco: Never Used  Substance Use Topics  . Alcohol use: Yes    Alcohol/week: 0.0 oz    Comment: occassional  . Drug use: No    Review of Systems  Constitutional:   No fever positive chills.  ENT:   No sore throat. No rhinorrhea. Cardiovascular:   No chest pain  or syncope. Respiratory:  positive shortness of breath without cough. Gastrointestinal:   Negative for abdominal pain, vomiting and diarrhea.  Musculoskeletal:   Negative for focal pain or swelling All other systems reviewed and are negative except as documented above in ROS and HPI.  ____________________________________________   PHYSICAL EXAM:  VITAL SIGNS: ED Triage Vitals  Enc Vitals Group     BP 11/02/17 1641 (!) 176/97     Pulse Rate 11/02/17 1641 88     Resp 11/02/17 1641 17     Temp --      Temp src --      SpO2 11/02/17 1641 100 %     Weight 11/02/17 1642 169 lb (76.7 kg)     Height 11/02/17 1642 5\' 9"  (1.753 m)     Head Circumference --      Peak Flow --      Pain Score 11/02/17 1642 0     Pain Loc --      Pain Edu? --      Excl. in [place]? --     Vital signs reviewed, nursing assessments reviewed.   Constitutional:   Alert and oriented. ill appearing, not in distress Eyes:   Conjunctivae are normal. EOMI. PERRL. ENT      Head:   Normocephalic and atraumatic.      Nose:   No congestion/rhinnorhea.       Mouth/Throat:   MMM, no pharyngeal erythema. No peritonsillar mass.       Neck:   No meningismus. Full ROM. Hematological/Lymphatic/Immunilogical:   No cervical lymphadenopathy. Cardiovascular:   RRR. Symmetric bilateral radial and DP pulses.  No murmurs.  Respiratory:   Normal respiratory effort without tachypnea/retractions. Breath sounds are clear and equal bilaterally. No wheezes/rales/rhonchi. Gastrointestinal:   Soft with diffuse tenderness, nonfocal. Non distended. There is no CVA tenderness.  No rebound, rigidity, or guarding.no hernia  Musculoskeletal:   Normal range of motion in all extremities. No joint effusions.  No lower extremity tenderness.  No edema. Neurologic:   Normal speech and language.  Motor grossly intact. cerebellar testing normal No acute focal neurologic deficits are appreciated.  Skin:    Skin is warm, dry and intact. No rash noted.   No petechiae, purpura, or bullae.  ____________________________________________    LABS (pertinent positives/negatives) (all labs ordered are listed, but only abnormal results are displayed) Labs Reviewed  BASIC METABOLIC PANEL - Abnormal; Notable for the following components:      Result Value   Glucose, Bld 122 (*)    BUN 21 (*)    Creatinine, Ser 1.52 (*)    GFR calc non Af Amer 42 (*)    GFR calc Af Amer 48 (*)    All other components within normal limits  CBC - Abnormal; Notable for the following components:   MCH 34.1 (*)    All other components within normal limits  URINALYSIS, COMPLETE (UACMP) WITH  MICROSCOPIC - Abnormal; Notable for the following components:   Color, Urine YELLOW (*)    APPearance CLEAR (*)    All other components within normal limits  TROPONIN I  TROPONIN I   ____________________________________________   EKG  interpreted by me Sinus rhythm rate of 89, normal axis intervals QRS ST segments and T waves. Evidence of left ventricular hypertrophy. No acute ischemic changes.  ____________________________________________    RADIOLOGY  Dg Chest 2 View  Result Date: 11/02/2017 CLINICAL DATA:  Dyspnea EXAM: CHEST - 2 VIEW COMPARISON:  None. FINDINGS: Mild pulmonary hyperinflation. Negative for infiltrate edema or effusion. Heart size and vascularity normal. Chronic left rib fractures IMPRESSION: No active cardiopulmonary disease. Electronically Signed   By: Franchot Gallo M.D.   On: 11/02/2017 17:43   Ct Head Wo Contrast  Result Date: 11/02/2017 CLINICAL DATA:  Near syncopal episode, dizziness, shortness of breath, diaphoretic. EXAM: CT HEAD WITHOUT CONTRAST TECHNIQUE: Contiguous axial images were obtained from the base of the skull through the vertex without intravenous contrast. COMPARISON:  None. FINDINGS: Brain: Mild generalized age related parenchymal atrophy with commensurate dilatation of the ventricles and sulci. Mild chronic small vessel  ischemic changes within the deep periventricular white matter regions bilaterally. No mass, hemorrhage, edema other evidence of acute parenchymal abnormality. No extra-axial hemorrhage. Vascular: There are chronic calcified atherosclerotic changes of the large vessels at the skull base. No unexpected hyperdense vessel. Skull: Normal. Negative for fracture or focal lesion. Sinuses/Orbits: No acute finding. Other: None. IMPRESSION: 1. No acute findings.  No intracranial mass, hemorrhage or edema. 2. Mild age-related atrophy and chronic small vessel ischemic changes in the white matter. Electronically Signed   By: Franki Cabot M.D.   On: 11/02/2017 17:35   Mr Brain Wo Contrast  Result Date: 11/02/2017 CLINICAL DATA:  Initial evaluation for near syncopal episode, dizziness. EXAM: MRI HEAD WITHOUT CONTRAST TECHNIQUE: Multiplanar, multiecho pulse sequences of the brain and surrounding structures were obtained without intravenous contrast. COMPARISON:  Prior CT from earlier the same day. FINDINGS: Brain: Cerebral volume within normal limits for age. Patchy T2/FLAIR hyperintensity within the periventricular and deep white matter both cerebral hemispheres, most consistent with chronic small vessel ischemic change, mild in nature. Mild chronic small vessel ischemic change present within the pons as well. Small remote lacunar infarct present within the left thalamus. No abnormal foci of restricted diffusion to suggest acute or subacute ischemia. Gray-white matter differentiation maintained. No other areas of remote infarction. No foci of susceptibility artifact to suggest acute or chronic intracranial hemorrhage. No mass lesion, midline shift or mass effect. No hydrocephalus. No extra-axial fluid collection. Major dural sinuses are grossly patent. Pituitary gland suprasellar region normal. Midline structures intact and normal. Vascular: Major intracranial vascular flow voids are well maintained. Skull and upper cervical  spine: Craniocervical junction normal. Upper cervical spine normal. Bone marrow signal intensity within normal limits. No scalp soft tissue abnormality. Sinuses/Orbits: Globes and orbital soft tissues within normal limits. Patient status post lens extraction bilaterally. Paranasal sinuses are clear. No mastoid effusion. Inner ear structures normal. Other: None. IMPRESSION: 1. No acute intracranial abnormality. 2. Mild chronic small vessel ischemic disease for age with small remote lacunar infarct within the left thalamus. Electronically Signed   By: Jeannine Boga M.D.   On: 11/02/2017 21:46   Ct Angio Chest/abd/pel For Dissection W And/or W/wo  Result Date: 11/02/2017 CLINICAL DATA:  Near syncopal episode. Dyspnea. Dizziness. Abdominal pain. EXAM: CT ANGIOGRAPHY CHEST, ABDOMEN AND PELVIS TECHNIQUE: Multidetector CT imaging  through the chest, abdomen and pelvis was performed using the standard protocol during bolus administration of intravenous contrast. Multiplanar reconstructed images and MIPs were obtained and reviewed to evaluate the vascular anatomy. CONTRAST:  84mL ISOVUE-370 IOPAMIDOL (ISOVUE-370) INJECTION 76% COMPARISON:  Chest radiograph from earlier today. 09/29/2013 abdominal radiographs. FINDINGS: CTA CHEST FINDINGS Cardiovascular: Normal heart size. No significant pericardial effusion/thickening. Left main and 3 vessel coronary atherosclerosis. Atherosclerotic nonaneurysmal thoracic aorta. No evidence of acute intramural hematoma, dissection, pseudoaneurysm or penetrating atherosclerotic ulcer in the thoracic aorta. Patent aortic arch branch vessels. Normal caliber main pulmonary artery. No central pulmonary emboli. Mediastinum/Nodes: No discrete thyroid nodules. Unremarkable esophagus. No pathologically enlarged axillary, mediastinal or hilar lymph nodes. Lungs/Pleura: No pneumothorax. No pleural effusion. No acute consolidative airspace disease, lung masses or significant pulmonary nodules.  Musculoskeletal: No aggressive appearing focal osseous lesions. Mild thoracic spondylosis. Review of the MIP images confirms the above findings. CTA ABDOMEN AND PELVIS FINDINGS VASCULAR Aorta: Atherosclerotic nonaneurysmal abdominal aorta. No dissection, penetrating atherosclerotic ulcer, vasculitis or significant stenosis. Celiac: Patent without evidence of aneurysm, dissection, vasculitis or significant stenosis. SMA: Patent without evidence of aneurysm, dissection, vasculitis or significant stenosis. Renals: Both renal arteries are patent without evidence of aneurysm, dissection, vasculitis, fibromuscular dysplasia or definite significant stenosis. IMA: Patent without evidence of aneurysm, dissection, vasculitis or significant stenosis. Inflow: Patent without evidence of aneurysm, dissection, vasculitis or significant stenosis. Veins: No obvious venous abnormality within the limitations of this arterial phase study. Review of the MIP images confirms the above findings. NON-VASCULAR Hepatobiliary: Normal liver size. Numerous simple liver cysts, largest 4.1 cm in the left liver lobe. Scattered granulomatous superior left liver calcifications. Normal gallbladder with no radiopaque cholelithiasis. No biliary ductal dilatation. Pancreas: Normal, with no mass or duct dilation. Spleen: Normal size. No mass. Adrenals/Urinary Tract: Normal adrenals. No hydronephrosis. A few nonobstructing left renal stones, largest 7 mm in in upper left renal infundibulum. No hydronephrosis. Simple exophytic 3.8 cm lower left renal cyst. No additional contour deforming renal lesions. Normal bladder. Stomach/Bowel: Normal non-distended stomach. Normal caliber small bowel with no small bowel wall thickening. Appendectomy. Moderate to marked left colonic diverticulosis, most prominent in the sigmoid colon, with no large bowel wall thickening or significant pericolonic fat stranding. Vascular/Lymphatic: See above for vascular findings. No  pathologically enlarged lymph nodes in the abdomen or pelvis. Reproductive: Moderately enlarged prostate. Other: No pneumoperitoneum, ascites or focal fluid collection. No evidence of a recurrent right inguinal hernia status post right inguinal hernia repair. Small fat containing left inguinal hernia. Musculoskeletal: No aggressive appearing focal osseous lesions. Mild lumbar spondylosis. Review of the MIP images confirms the above findings. IMPRESSION: 1. No acute aortic syndrome.  Aortic Atherosclerosis (ICD10-I70.0). 2. Left main and 3 vessel coronary atherosclerosis. 3. No active pulmonary disease. 4. No acute abnormality in the abdomen or pelvis. No evidence of bowel obstruction or acute bowel inflammation. Moderate to marked left colonic diverticulosis, with no evidence of acute diverticulitis. 5. Nonobstructing left nephrolithiasis. 6. Moderate prostatomegaly. 7. Small fat containing left inguinal hernia. Electronically Signed   By: Ilona Sorrel M.D.   On: 11/02/2017 19:08    ____________________________________________   PROCEDURES Procedures  ____________________________________________  DIFFERENTIAL DIAGNOSIS   spontaneous intracranial hemorrhage, stroke, pulmonary embolism, aortic aneurysm or dissection, bowel obstruction or perforation.  CLINICAL IMPRESSION / ASSESSMENT AND PLAN / ED COURSE  Pertinent labs & imaging results that were available during my care of the patient were reviewed by me and considered in my medical decision making (see chart for  details).      Clinical Course as of Nov 02 2241  Tue Nov 02, 2017  1659 Pt p/w dizziness, nausea. Neuro exam nonfocal, code stroke not warranted. Patient is not septic. and the differential is broad given his combination of neurologic, pulmonary, and abdominal complaints and findings.Will start with labs, CT head for possible ICH, chest xray. IV saline for hydration and IV zofran for nausea.   [PS]  6283 Sx resolved. BP about  155/90. CT head negative. CXR negative. Labs all normal with baseline ckd. Reassuring workup thus far. Will obtain CTA chest, abd/pelvis and MRI brain to complete ED workup. If this imaging is reassuring and second trop is negative, I think pt is suitable for DC home.    [PS]  2047 CTA chest abd pelvis unremarkable. No aortic disease or evidence of PE. Diffuse CAD, should follow up with cardiology outpatient. Awaiting MRI brain.    [PS]  2238 Mri brain normal. Extensive workup unremarkable. Pt stable for outpatient follow up.    [PS]    Clinical Course User Index [PS] Carrie Mew, MD     ____________________________________________   FINAL CLINICAL IMPRESSION(S) / ED DIAGNOSES    Final diagnoses:  Syncope, unspecified syncope type  Atherosclerosis of native coronary artery of native heart without angina pectoris     ED Discharge Orders    None      Portions of this note were generated with dragon dictation software. Dictation errors may occur despite best attempts at proofreading.    Carrie Mew, MD 11/02/17 2244

## 2017-11-02 NOTE — Progress Notes (Signed)
Per protocol rechecked patient's BP repeat BP is 163/118. Pt states that he is experiencing dizziness and nausea. Called patient's PCP at [facility] was placed on hold, during that time patient became more ill, stating that he felt severely nauseated and dizzy. Patient states that "the room is spinning" escorted patient to the ED. Called pt's wife informed her to meet [place] in the ED. Patient handed off to ED nurse.

## 2017-11-02 NOTE — ED Triage Notes (Signed)
Pt to ED via wheelchair [facility] c/o near syncopal episode, dizziness, SOB, diaphoretic.  Denies pain, states was at PCP for yearly checkup and waiting for PCP when symptoms started suddenly.  Pt presents pale, diaphoretic, A&Ox4, chest rise even and unlabored.

## 2017-11-03 ENCOUNTER — Encounter: Payer: Self-pay | Admitting: Urology

## 2017-11-03 NOTE — Progress Notes (Signed)
11/02/2017 8:14 AM   Barry Pace 1936/06/13 [id number]  Referring provider: Leonel Ramsay, MD [place], [place] [address]  Chief Complaint  Patient presents with  . Follow-up   Urologic problem list: -Recurrent stone disease, nonobstructing left nephrolithiasis-  HPI: 82 year old male presents for annual follow-up.  I last saw him at [facility] in early May 2018.  He declined a 24-hour urine study secondary to cost.  He is on potassium citrate.  He has had no flank/abdominal pain since his last visit.   PMH: Past Medical History:  Diagnosis Date  . Anemia   . Arthritis   . Cancer (HCC)    Pre-cancerous skin cells  . GERD (gastroesophageal reflux disease)   . History of BPH   . History of kidney stones   . Hyperlipidemia   . PUD (peptic ulcer disease)     Surgical History: Past Surgical History:  Procedure Laterality Date  . APPENDECTOMY    . EYE SURGERY Bilateral    Cataract Extraction with IOL  . HERNIA REPAIR Right 1999   Inguinal Hernia Repair  . INGUINAL HERNIA REPAIR Left 06/19/2016   Procedure: HERNIA REPAIR INGUINAL ADULT;  Surgeon: Leonie Green, MD;  Location: [facility] ORS;  Service: General;  Laterality: Left;    Home Medications:  Allergies as of 11/02/2017      Reactions   Atorvastatin    Other reaction(s): Muscle Pain   Escitalopram    Other reaction(s): Other (See Comments) Sedation   Ezetimibe-simvastatin Diarrhea   Other reaction(s): Muscle Pain Vytorin 10-20   Penicillins Hives   Rosuvastatin    Other reaction(s): Muscle Pain      Medication List        Accurate as of 11/02/17 11:59 PM. Always use your most recent med list.          carboxymethylcellulose 1 % ophthalmic solution Apply 1 drop to eye 3 (three) times daily as needed (for dry/itchy eyes). Thera Tears.   desonide 0.05 % cream Commonly known as:  DESOWEN Apply 1 application topically 2 (two) times daily as needed (for dry/scaly areas in  scalp).   fluticasone 50 MCG/ACT nasal spray Commonly known as:  FLONASE Place 1 spray into both nostrils 2 (two) times daily as needed (for cold/allergy symptoms.).   folic acid 301 MCG tablet Commonly known as:  FOLVITE Take 400 mcg by mouth daily.   L-Lysine 500 MG Caps Take 500 mg by mouth daily as needed (for fever blisters/cold sores.).   loratadine 10 MG tablet Commonly known as:  CLARITIN Take 10 mg by mouth daily as needed (for upper respiratory issues).   meloxicam 7.5 MG tablet Commonly known as:  MOBIC Take 7.5 mg by mouth daily.   nabumetone 500 MG tablet Commonly known as:  RELAFEN Take 500 mg by mouth 2 (two) times daily.   omeprazole 20 MG capsule Commonly known as:  PRILOSEC Take 20 mg by mouth daily before breakfast.   potassium citrate 10 MEQ (1080 MG) SR tablet Commonly known as:  UROCIT-K Take 10 mEq by mouth 2 (two) times daily.   vitamin C 500 MG tablet Commonly known as:  ASCORBIC ACID Take 500 mg by mouth daily.   VOLTAREN 1 % Gel Generic drug:  diclofenac sodium Apply 1 application topically 4 (four) times daily as needed. For pain/inflammation       Allergies:  Allergies  Allergen Reactions  . Atorvastatin     Other reaction(s): Muscle Pain  .  Escitalopram     Other reaction(s): Other (See Comments) Sedation  . Ezetimibe-Simvastatin Diarrhea    Other reaction(s): Muscle Pain Vytorin 10-20  . Penicillins Hives  . Rosuvastatin     Other reaction(s): Muscle Pain    Family History: History reviewed. No pertinent family history.  Social History:  reports that he has never smoked. He has never used smokeless tobacco. He reports that he drinks alcohol. He reports that he does not use drugs.  ROS: UROLOGY Frequent Urination?: Yes Hard to postpone urination?: No Burning/pain with urination?: No Get up at night to urinate?: Yes Leakage of urine?: No Urine stream starts and stops?: No Trouble starting stream?: No Do you have to  strain to urinate?: No Blood in urine?: No Urinary tract infection?: No Sexually transmitted disease?: No Injury to kidneys or bladder?: No Painful intercourse?: No Weak stream?: No Erection problems?: No Penile pain?: No  Gastrointestinal Nausea?: No Vomiting?: No Indigestion/heartburn?: No Diarrhea?: No Constipation?: No  Constitutional Fever: No Night sweats?: No Weight loss?: No Fatigue?: No  Skin Skin rash/lesions?: No Itching?: No  Eyes Blurred vision?: No Double vision?: No  Ears/Nose/Throat Sore throat?: No Sinus problems?: No  Hematologic/Lymphatic Swollen glands?: No Easy bruising?: Yes  Cardiovascular Leg swelling?: Yes Chest pain?: No  Respiratory Cough?: No Shortness of breath?: No  Endocrine Excessive thirst?: No  Musculoskeletal Back pain?: No Joint pain?: Yes  Neurological Headaches?: No Dizziness?: No  Psychologic Depression?: No Anxiety?: No  Physical Exam: BP (!) 190/95 (BP Location: Right Arm, Patient Position: Sitting, Cuff Size: Normal)   Pulse 85   Resp 16   Ht 5\' 9"  (1.753 m)   Wt 169 lb 12.8 oz (77 kg)   SpO2 96%   BMI 25.08 kg/m   Constitutional:  Alert and oriented, No acute distress. HEENT: [place] AT, moist mucus membranes.  Trachea midline, no masses. Cardiovascular: No clubbing, cyanosis, or edema. Respiratory: Normal respiratory effort, no increased work of breathing. GI: Abdomen is soft, nontender, nondistended, no abdominal masses GU: No CVA tenderness Lymph: No cervical or inguinal lymphadenopathy. Skin: No rashes, bruises or suspicious lesions. Neurologic: Grossly intact, no focal deficits, moving all 4 extremities. Psychiatric: Normal mood and affect.  Laboratory Data: Lab Results  Component Value Date   WBC 6.6 11/02/2017   HGB 15.6 11/02/2017   HCT 43.8 11/02/2017   MCV 95.6 11/02/2017   PLT 238 11/02/2017    Lab Results  Component Value Date   CREATININE 1.52 (H) 11/02/2017     Urinalysis    Component Value Date/Time   COLORURINE YELLOW (A) 11/02/2017 1644   APPEARANCEUR CLEAR (A) 11/02/2017 1644   LABSPEC 1.013 11/02/2017 1644   PHURINE 8.0 11/02/2017 1644   GLUCOSEU NEGATIVE 11/02/2017 1644   HGBUR NEGATIVE 11/02/2017 [place] 11/02/2017 [place] 11/02/2017 1644   PROTEINUR NEGATIVE 11/02/2017 1644   NITRITE NEGATIVE 11/02/2017 1644   LEUKOCYTESUR NEGATIVE 11/02/2017 1644    Lab Results  Component Value Date   BACTERIA NONE SEEN 11/02/2017     Assessment & Plan:   82 year old male with nonobstructing left nephrolithiasis.  I recommended a follow-up KUB however he states just prior to me seeing him that he had an episode of vertigo and nausea which worsened and he was transported to the ED for further evaluation.  He did have a CT as part of his work-up which showed nonobstructing left nephrolithiasis.  Recommend he continue potassium citrate and continue annual follow-up.   Return  in about 1 year (around 11/03/2018) for KUB, Recheck.    Abbie Sons, [place] [address] [place], [place] [address] [phone]

## 2017-11-04 ENCOUNTER — Telehealth: Payer: Self-pay

## 2017-11-04 NOTE — Telephone Encounter (Signed)
Lmov for patient to call back  He was seen in ED on 11/02/17    Will call back at a later time

## 2017-11-05 NOTE — Telephone Encounter (Signed)
Pt wife states pt has an appt with Dr. Ubaldo Glassing next week.

## 2017-11-11 ENCOUNTER — Telehealth: Payer: Self-pay | Admitting: Urology

## 2017-11-11 MED ORDER — POTASSIUM CITRATE ER 10 MEQ (1080 MG) PO TBCR: 10.0000 meq | EXTENDED_RELEASE_TABLET | Freq: Two times a day (BID) | ORAL | 11 refills | Status: DC

## 2017-11-11 NOTE — Telephone Encounter (Signed)
Pt needs refill to [facility] on [address]  potassium citrate (UROCIT-K) 10 MEQ (1080 MG) SR tablet [[id number]]

## 2017-11-12 ENCOUNTER — Ambulatory Visit
Admission: RE | Admit: 2017-11-12 | Discharge: 2017-11-12 | Disposition: A | Discharge status: Home / Self Care | Payer: Medicare Other | Source: Ambulatory Visit | Attending: Urology | Admitting: Urology

## 2017-11-12 DIAGNOSIS — N200 Calculus of kidney: Secondary | ICD-10-CM | POA: Insufficient documentation

## 2017-11-16 ENCOUNTER — Telehealth: Payer: Self-pay

## 2017-11-16 NOTE — Telephone Encounter (Signed)
-----   Message from Abbie Sons, MD sent at 11/16/2017 11:33 AM EDT ----- KUB shows stable renal calculi

## 2017-11-16 NOTE — Telephone Encounter (Signed)
Pt informed

## 2017-11-22 ENCOUNTER — Other Ambulatory Visit: Payer: Self-pay | Admitting: Infectious Diseases

## 2017-11-22 DIAGNOSIS — R42 Dizziness and giddiness: Secondary | ICD-10-CM

## 2017-11-22 DIAGNOSIS — E782 Mixed hyperlipidemia: Secondary | ICD-10-CM

## 2017-11-26 ENCOUNTER — Ambulatory Visit
Admission: RE | Admit: 2017-11-26 | Discharge: 2017-11-26 | Disposition: A | Discharge status: Home / Self Care | Payer: Medicare Other | Source: Ambulatory Visit | Attending: Infectious Diseases | Admitting: Infectious Diseases

## 2017-11-26 DIAGNOSIS — E782 Mixed hyperlipidemia: Secondary | ICD-10-CM | POA: Diagnosis not present

## 2017-11-26 DIAGNOSIS — R42 Dizziness and giddiness: Secondary | ICD-10-CM | POA: Insufficient documentation

## 2018-01-03 ENCOUNTER — Emergency Department: Payer: Medicare Other

## 2018-01-03 ENCOUNTER — Emergency Department
Admission: EM | Admit: 2018-01-03 | Discharge: 2018-01-03 | Disposition: A | Discharge status: Home / Self Care | Payer: Medicare Other | Attending: Emergency Medicine | Admitting: Emergency Medicine

## 2018-01-03 DIAGNOSIS — E785 Hyperlipidemia, unspecified: Secondary | ICD-10-CM | POA: Insufficient documentation

## 2018-01-03 DIAGNOSIS — N200 Calculus of kidney: Secondary | ICD-10-CM | POA: Diagnosis not present

## 2018-01-03 DIAGNOSIS — Z79899 Other long term (current) drug therapy: Secondary | ICD-10-CM | POA: Diagnosis not present

## 2018-01-03 DIAGNOSIS — R1032 Left lower quadrant pain: Secondary | ICD-10-CM | POA: Diagnosis present

## 2018-01-03 LAB — COMPREHENSIVE METABOLIC PANEL
ALK PHOS: 79 U/L (ref 38–126)
ALT: 28 U/L (ref 0–44)
AST: 31 U/L (ref 15–41)
Albumin: 4.4 g/dL (ref 3.5–5.0)
Anion gap: 12 (ref 5–15)
BILIRUBIN TOTAL: 1.1 mg/dL (ref 0.3–1.2)
BUN: 17 mg/dL (ref 8–23)
CO2: 24 mmol/L (ref 22–32)
CREATININE: 1.99 mg/dL — AB (ref 0.61–1.24)
Calcium: 10 mg/dL (ref 8.9–10.3)
Chloride: 100 mmol/L (ref 98–111)
GFR calc Af Amer: 35 mL/min — ABNORMAL LOW (ref >60)
GFR, EST NON AFRICAN AMERICAN: 30 mL/min — AB (ref >60)
Glucose, Bld: 133 mg/dL — ABNORMAL HIGH (ref 70–99)
Potassium: 4 mmol/L (ref 3.5–5.1)
Sodium: 136 mmol/L (ref 135–145)
TOTAL PROTEIN: 7.7 g/dL (ref 6.5–8.1)

## 2018-01-03 LAB — CBC
HCT: 46.7 % (ref 40.0–52.0)
Hemoglobin: 17.2 g/dL (ref 13.0–18.0)
MCH: 35.6 pg — ABNORMAL HIGH (ref 26.0–34.0)
MCHC: 36.8 g/dL — ABNORMAL HIGH (ref 32.0–36.0)
MCV: 96.9 fL (ref 80.0–100.0)
PLATELETS: 212 10*3/uL (ref 150–440)
RBC: 4.82 MIL/uL (ref 4.40–5.90)
RDW: 13.1 % (ref 11.5–14.5)
WBC: 6.9 10*3/uL (ref 3.8–10.6)

## 2018-01-03 LAB — URINALYSIS, COMPLETE (UACMP) WITH MICROSCOPIC
BILIRUBIN URINE: NEGATIVE
Bacteria, UA: NONE SEEN
Glucose, UA: NEGATIVE mg/dL
Hgb urine dipstick: NEGATIVE
Ketones, ur: 20 mg/dL — AB
LEUKOCYTES UA: NEGATIVE
Nitrite: NEGATIVE
PH: 7 (ref 5.0–8.0)
Protein, ur: NEGATIVE mg/dL
SPECIFIC GRAVITY, URINE: 1.012 (ref 1.005–1.030)
SQUAMOUS EPITHELIAL / LPF: NONE SEEN (ref 0–5)

## 2018-01-03 LAB — LIPASE, BLOOD: Lipase: 41 U/L (ref 11–51)

## 2018-01-03 MED ORDER — KETOROLAC TROMETHAMINE 30 MG/ML IJ SOLN
30.0000 mg | Freq: Once | INTRAMUSCULAR | Status: AC
  Administered 2018-01-03: 30 mg via INTRAVENOUS
  Filled 2018-01-03: qty 1

## 2018-01-03 MED ORDER — ONDANSETRON HCL 4 MG/2ML IJ SOLN
4.0000 mg | Freq: Once | INTRAMUSCULAR | Status: AC | PRN
  Administered 2018-01-03: 4 mg via INTRAVENOUS
  Filled 2018-01-03: qty 2

## 2018-01-03 MED ORDER — METOCLOPRAMIDE HCL 5 MG/ML IJ SOLN
10.0000 mg | Freq: Once | INTRAMUSCULAR | Status: AC
  Administered 2018-01-03: 10 mg via INTRAVENOUS

## 2018-01-03 MED ORDER — TAMSULOSIN HCL 0.4 MG PO CAPS: 0.4000 mg | ORAL_CAPSULE | Freq: Every day | ORAL | 0 refills | Status: DC

## 2018-01-03 MED ORDER — MORPHINE SULFATE (PF) 4 MG/ML IV SOLN
4.0000 mg | Freq: Once | INTRAVENOUS | Status: AC
  Administered 2018-01-03: 4 mg via INTRAVENOUS
  Filled 2018-01-03: qty 1

## 2018-01-03 MED ORDER — METOCLOPRAMIDE HCL 5 MG/ML IJ SOLN
INTRAMUSCULAR | Status: AC
  Filled 2018-01-03: qty 2

## 2018-01-03 MED ORDER — ONDANSETRON 4 MG PO TBDP: 4.0000 mg | ORAL_TABLET | Freq: Three times a day (TID) | ORAL | 0 refills | Status: DC | PRN

## 2018-01-03 MED ORDER — MORPHINE SULFATE (PF) 4 MG/ML IV SOLN
4.0000 mg | Freq: Once | INTRAVENOUS | Status: AC
  Administered 2018-01-03: 4 mg via INTRAVENOUS

## 2018-01-03 MED ORDER — OXYCODONE-ACETAMINOPHEN 5-325 MG PO TABS: 2.0000 | ORAL_TABLET | ORAL | 0 refills | Status: DC | PRN

## 2018-01-03 MED ORDER — SODIUM CHLORIDE 0.9 % IV BOLUS
1000.0000 mL | Freq: Once | INTRAVENOUS | Status: AC
Start: 2018-01-03 — End: 2018-01-03
  Administered 2018-01-03: 1000 mL via INTRAVENOUS

## 2018-01-03 MED ORDER — MORPHINE SULFATE (PF) 4 MG/ML IV SOLN
INTRAVENOUS | Status: AC
  Filled 2018-01-03: qty 1

## 2018-01-03 MED ORDER — IOHEXOL 300 MG/ML  SOLN
75.0000 mL | Freq: Once | INTRAMUSCULAR | Status: AC | PRN
  Administered 2018-01-03: 75 mL via INTRAVENOUS

## 2018-01-03 NOTE — Discharge Instructions (Addendum)
Please follow-up with your urologist for further evaluation and treatment of your kidney stone.  It is a large kidney stone and there is a possibility that he will not be able to pass it on its own.  Please return with any worsening pain any fevers or any blood in your urine.

## 2018-01-03 NOTE — ED Triage Notes (Signed)
Patient coming [facility] from home. Patient c/o LLQ abdominal pain. Patient reports nausea, denies vomiting and diarrhea. Patient reports hx of kidney stones and veritgo. Patient denies urinary symptoms. Patient is constantly belching throughout triage and EMS ride.  EMS vitals: 190/108, HR 95.

## 2018-01-03 NOTE — ED Notes (Signed)
Resumed care from [place], [place]. Pt resting with family at bedside, reports increased pain.

## 2018-01-03 NOTE — ED Provider Notes (Signed)
[facility] Emergency Department Provider Note   ____________________________________________   First MD Initiated Contact with Patient 01/03/18 [id number]     (approximate)  I have reviewed the triage vital signs and the nursing notes.   HISTORY  Chief Complaint Abdominal Pain    HPI Barry Pace is a 82 y.o. male who comes into the hospital today with some left lower quadrant pain.  The patient states that it started yesterday.  It sharp and stabbing.  He states that he had a few normal bowel movements.  The patient has a history of kidney stones.  He has been nauseous but has not been able to vomit.  He is been having dry heaves.  He had a similar episode a couple weeks ago but it went away.  The pain started around 5 PM.  He took a Tums but the pain persisted.  He rates his pain a 10 out of 10 in intensity currently.  He denies any pain with urination or blood in his urine.  The patient denies any chest pain, shortness of breath and he has not taken his temperature to see if he had any fevers.  He is here for evaluation.   Past Medical History:  Diagnosis Date  . Anemia   . Arthritis   . Cancer (HCC)    Pre-cancerous skin cells  . GERD (gastroesophageal reflux disease)   . History of BPH   . History of kidney stones   . Hyperlipidemia   . PUD (peptic ulcer disease)     There are no active problems to display for this patient.   Past Surgical History:  Procedure Laterality Date  . APPENDECTOMY    . EYE SURGERY Bilateral    Cataract Extraction with IOL  . HERNIA REPAIR Right 1999   Inguinal Hernia Repair  . INGUINAL HERNIA REPAIR Left 06/19/2016   Procedure: HERNIA REPAIR INGUINAL ADULT;  Surgeon: Leonie Green, MD;  Location: [facility] ORS;  Service: General;  Laterality: Left;    Prior to Admission medications   Medication Sig Start Date End Date Taking? Authorizing Provider  calcium carbonate (TUMS - DOSED IN MG ELEMENTAL CALCIUM) 500 MG  chewable tablet Chew 1-2 tablets by mouth daily as needed for indigestion or heartburn.   Yes [provider]  carboxymethylcellulose 1 % ophthalmic solution Apply 1 drop to eye 3 (three) times daily as needed (for dry/itchy eyes). Thera Tears.   Yes [provider]  desonide (DESOWEN) 0.05 % cream Apply 1 application topically 2 (two) times daily as needed (for dry/scaly areas in scalp).    Yes [provider]  ezetimibe (ZETIA) 10 MG tablet Take 10 mg by mouth daily. 11/10/17  Yes [provider]  fluticasone (FLONASE) 50 MCG/ACT nasal spray Place 1 spray into both nostrils 2 (two) times daily as needed (for cold/allergy symptoms.).   Yes [provider]  folic acid (FOLVITE) 542 MCG tablet Take 400 mcg by mouth daily.    Yes [provider]  L-Lysine 500 MG CAPS Take 500 mg by mouth daily as needed (for fever blisters/cold sores.).   Yes [provider]  loratadine (CLARITIN) 10 MG tablet Take 10 mg by mouth daily as needed (for upper respiratory issues).   Yes [provider]  potassium citrate (UROCIT-K) 10 MEQ (1080 MG) SR tablet Take 1 tablet (10 mEq total) by mouth 2 (two) times daily. 11/11/17  Yes Stoioff, Ronda Fairly, MD  VOLTAREN 1 % GEL Apply  1 application topically 4 (four) times daily as needed. For pain/inflammation 05/21/16  Yes [provider]  ondansetron (ZOFRAN ODT) 4 MG disintegrating tablet Take 1 tablet (4 mg total) by mouth every 8 (eight) hours as needed for nausea or vomiting. 01/03/18   Loney Hering, MD  oxyCODONE-acetaminophen (PERCOCET/ROXICET) 5-325 MG tablet Take 2 tablets by mouth every 4 (four) hours as needed for severe pain. 01/03/18   Loney Hering, MD  tamsulosin (FLOMAX) 0.4 MG CAPS capsule Take 1 capsule (0.4 mg total) by mouth daily. 01/03/18   Loney Hering, MD    Allergies Atorvastatin; Escitalopram; Ezetimibe-simvastatin; Penicillins; and Rosuvastatin  No family  history on file.  Social History Social History   Tobacco Use  . Smoking status: Never Smoker  . Smokeless tobacco: Never Used  Substance Use Topics  . Alcohol use: Yes    Alcohol/week: 0.0 oz    Comment: occassional  . Drug use: No    Review of Systems  Constitutional: No fever/chills Eyes: No visual changes. ENT: No sore throat. Cardiovascular: Denies chest pain. Respiratory: Denies shortness of breath. Gastrointestinal:  abdominal pain.  nausea, no vomiting.  No diarrhea.  No constipation. Genitourinary: Negative for dysuria. Musculoskeletal: Negative for back pain. Skin: Negative for rash. Neurological: Negative for headaches, focal weakness or numbness.   ____________________________________________   PHYSICAL EXAM:  VITAL SIGNS: ED Triage Vitals  Enc Vitals Group     BP 01/03/18 0539 (!) 209/105     Pulse Rate 01/03/18 0539 92     Resp 01/03/18 0539 20     Temp 01/03/18 0539 98.2 F (36.8 C)     Temp Source 01/03/18 0539 Oral     SpO2 01/03/18 0539 100 %     Weight 01/03/18 0541 158 lb (71.7 kg)     Height 01/03/18 0541 5\' 9"  (1.753 m)     Head Circumference --      Peak Flow --      Pain Score 01/03/18 0615 8     Pain Loc --      Pain Edu? --      Excl. in [place]? --     Constitutional: Alert and oriented. Well appearing and in moderate to severe distress. Eyes: Conjunctivae are normal. PERRL. EOMI. Head: Atraumatic. Nose: No congestion/rhinnorhea. Mouth/Throat: Mucous membranes are moist.  Oropharynx non-erythematous. Cardiovascular: Normal rate, regular rhythm. Grossly normal heart sounds.  Good peripheral circulation. Respiratory: Normal respiratory effort.  No retractions. Lungs CTAB. Gastrointestinal: Soft with some left lower quadrant abdominal pain to palpation. No distention.  Positive bowel sounds Musculoskeletal: No lower extremity tenderness nor edema.   Neurologic:  Normal speech and language.  Skin:  Skin is warm, dry and  intact. Psychiatric: Mood and affect are normal.   ____________________________________________   LABS (all labs ordered are listed, but only abnormal results are displayed)  Labs Reviewed  COMPREHENSIVE METABOLIC PANEL - Abnormal; Notable for the following components:      Result Value   Glucose, Bld 133 (*)    Creatinine, Ser 1.99 (*)    GFR calc non Af Amer 30 (*)    GFR calc Af Amer 35 (*)    All other components within normal limits  CBC - Abnormal; Notable for the following components:   MCH 35.6 (*)    MCHC 36.8 (*)    All other components within normal limits  URINALYSIS, COMPLETE (UACMP) WITH MICROSCOPIC - Abnormal; Notable for the following components:   Color, Urine YELLOW (*)  APPearance CLEAR (*)    Ketones, ur 20 (*)    All other components within normal limits  LIPASE, BLOOD   ____________________________________________  EKG  none ____________________________________________  RADIOLOGY  ED MD interpretation:  CT abd and pelvis: Left sided obstructive uropathy with 8 x 5 mm ureteropelvic junction stone causing moderate hydronephrosis and perinephric stranding.  Multiple bilateral nonobstructing renal calculi in addition to above described obstructing UPJ stone.  Enlarged prostate.  Official radiology report(s): Ct Abdomen Pelvis W Contrast  Result Date: 01/03/2018 CLINICAL DATA:  Acute abdominal pain EXAM: CT ABDOMEN AND PELVIS WITH CONTRAST TECHNIQUE: Multidetector CT imaging of the abdomen and pelvis was performed using the standard protocol following bolus administration of intravenous contrast. CONTRAST:  20mL OMNIPAQUE IOHEXOL 300 MG/ML  SOLN COMPARISON:  Chest CT 11/02/2017 FINDINGS: LOWER CHEST: No basilar pulmonary nodules or pleural effusion. No apical pericardial effusion. HEPATOBILIARY: Multiple hypodense hepatic lesions, most consistent with benign cysts. Normal gallbladder. PANCREAS: Normal parenchymal contours without ductal dilatation. No  peripancreatic fluid collection. SPLEEN: Normal. ADRENALS/URINARY TRACT: --Adrenal glands: Normal. --Right kidney/ureter: 3-4 mm nonobstructive right lower pole stone at the renal pelvis. --Left kidney/ureter: Moderate hydroureteronephrosis with obstructing stone at the ureteropelvic junction measuring 8 x 5 mm. There is moderate perinephric stranding. There are multiple other nonobstructing renal calculi that measure up to 4 mm. Distal right ureter is normal. Lower pole cyst measures 4 cm. No excretion from the left kidney on the delayed phase images. --Urinary bladder: Normal for degree of distention STOMACH/BOWEL: --Stomach/Duodenum: No hiatal hernia or other gastric abnormality. Normal duodenal course. --Small bowel: No dilatation or inflammation. --Colon: Rectosigmoid diverticulosis without acute inflammation. --Appendix: Not visualized. No right lower quadrant inflammation or free fluid. VASCULAR/LYMPHATIC: Atherosclerotic calcification is present within the non-aneurysmal abdominal aorta, without hemodynamically significant stenosis. The portal vein, splenic vein, superior mesenteric vein and IVC are patent. No abdominal or pelvic lymphadenopathy. REPRODUCTIVE: Prostate is enlarged measuring 5.7 cm. MUSCULOSKELETAL. No bony spinal canal stenosis or focal osseous abnormality. OTHER: None. IMPRESSION: 1. Left-sided obstructive uropathy with 8 x 5 mm ureteropelvic junction stone causing moderate hydronephrosis and perinephric stranding. 2. Multiple bilateral nonobstructing renal calculi, in addition to the above-described obstructing UPJ stone. 3. Enlarged prostate gland. 4.  Aortic Atherosclerosis (ICD10-I70.0). Electronically Signed   By: Ulyses Jarred M.D.   On: 01/03/2018 07:01    ____________________________________________   PROCEDURES  Procedure(s) performed: None  Procedures  Critical Care performed: No  ____________________________________________   INITIAL IMPRESSION / ASSESSMENT AND  PLAN / ED COURSE  As part of my medical decision making, I reviewed the following data within the electronic MEDICAL RECORD NUMBER Notes from prior ED visits and [place] Controlled Substance Database   This is an 82 year old male who comes into the hospital today with some left-sided lower abdominal pain.  My differential diagnosis includes diverticulitis, colitis, perforated viscus, kidney stone.  We did check some blood work to include a CBC, CMP, urinalysis and a lipase.  Patient's blood work is unremarkable.  He does not have any blood in his urine or signs of infection.  I did send the patient for a CT scan of his abdomen and pelvis and it was found that he has an 8 x 5 mm UPJ stone.  I did give the patient 2 doses of morphine as well as a liter of normal saline.  He also received some Zofran and Reglan.  The patient was still having pain after the medication so I gave him a shot of Toradol and  his pain is now a 0.  The patient will be discharged home and encouraged to follow-up with his urologist.  The patient has no other concerns at this time.      ____________________________________________   FINAL CLINICAL IMPRESSION(S) / ED DIAGNOSES  Final diagnoses:  Kidney stone     ED Discharge Orders        Ordered    oxyCODONE-acetaminophen (PERCOCET/ROXICET) 5-325 MG tablet  Every 4 hours PRN     01/03/18 0837    tamsulosin (FLOMAX) 0.4 MG CAPS capsule  Daily     01/03/18 0837    ondansetron (ZOFRAN ODT) 4 MG disintegrating tablet  Every 8 hours PRN     01/03/18 0837       Note:  This document was prepared using Dragon voice recognition software and may include unintentional dictation errors.    Loney Hering, MD 01/03/18 [phone]

## 2018-01-03 NOTE — ED Notes (Signed)
Patient transported to CT 

## 2018-01-03 NOTE — ED Notes (Signed)
Pt discharged home after verbalizing understanding of discharge instructions; nad noted. 

## 2018-01-05 ENCOUNTER — Encounter: Payer: Self-pay | Admitting: Urology

## 2018-01-05 ENCOUNTER — Other Ambulatory Visit: Payer: Self-pay | Admitting: Radiology

## 2018-01-05 ENCOUNTER — Ambulatory Visit (INDEPENDENT_AMBULATORY_CARE_PROVIDER_SITE_OTHER): Payer: Medicare Other | Admitting: Urology

## 2018-01-05 VITALS — BP 173/79 | HR 86 | Resp 16 | Ht 69.0 in | Wt 168.5 lb

## 2018-01-05 DIAGNOSIS — N23 Unspecified renal colic: Secondary | ICD-10-CM | POA: Insufficient documentation

## 2018-01-05 DIAGNOSIS — N201 Calculus of ureter: Secondary | ICD-10-CM

## 2018-01-05 DIAGNOSIS — N200 Calculus of kidney: Secondary | ICD-10-CM | POA: Diagnosis not present

## 2018-01-05 LAB — URINALYSIS, COMPLETE
Bilirubin, UA: NEGATIVE
GLUCOSE, UA: NEGATIVE
KETONES UA: NEGATIVE
Leukocytes, UA: NEGATIVE
NITRITE UA: NEGATIVE
Protein, UA: NEGATIVE
SPEC GRAV UA: 1.015 (ref 1.005–1.030)
UUROB: 0.2 mg/dL (ref 0.2–1.0)
pH, UA: 5.5 (ref 5.0–7.5)

## 2018-01-05 LAB — MICROSCOPIC EXAMINATION
Bacteria, UA: NONE SEEN
Epithelial Cells (non renal): NONE SEEN /hpf (ref 0–10)
WBC UA: NONE SEEN /HPF (ref 0–5)

## 2018-01-05 NOTE — H&P (View-Only) (Signed)
01/05/2018 2:34 PM   Barry Pace 1935/10/21 [id number]  Referring provider: No referring provider defined for this encounter.  Chief Complaint  Patient presents with  . Nephrolithiasis    HPI: Barry Pace is an 82 year old male who presents for evaluation of the left renal colic.  He has a history of recurrent calcium oxalate nephrolithiasis and has had several previous ESWLs the last in August 2011.  He has been on potassium citrate for several years.  He was last seen in May 2019 and KUB showed bilateral, nonobstructing renal calculi with a 9 mm left lower pole calculus.  On 7/14 he had onset of severe left lower quadrant abdominal pain associated with nausea and dry heaves. Severity was rated 10/10 without identifiable precipitating, aggravating or alleviating factors.  He denied fever or chills.  He presented to the ED on the a.m. of 7/15 a stone protocol CT of the abdomen and pelvis showed a 5 x 8 mm left proximal ureteral calculus with moderate hydronephrosis/hydroureter and bilateral, nonobstructing renal calculi.  Since his ED visit his pain has been controlled with oxycodone and has been intermittent.   PMH: Past Medical History:  Diagnosis Date  . Anemia   . Arthritis   . Cancer (HCC)    Pre-cancerous skin cells  . GERD (gastroesophageal reflux disease)   . History of BPH   . History of kidney stones   . Hyperlipidemia   . PUD (peptic ulcer disease)     Surgical History: Past Surgical History:  Procedure Laterality Date  . APPENDECTOMY    . EYE SURGERY Bilateral    Cataract Extraction with IOL  . HERNIA REPAIR Right 1999   Inguinal Hernia Repair  . INGUINAL HERNIA REPAIR Left 06/19/2016   Procedure: HERNIA REPAIR INGUINAL ADULT;  Surgeon: Leonie Green, MD;  Location: [facility] ORS;  Service: General;  Laterality: Left;    Home Medications:  Allergies as of 01/05/2018      Reactions   Atorvastatin    Other reaction(s): Muscle Pain   Escitalopram     Other reaction(s): Other (See Comments) Sedation   Ezetimibe-simvastatin Diarrhea   Other reaction(s): Muscle Pain Vytorin 10-20   Penicillins Hives   Rosuvastatin    Other reaction(s): Muscle Pain      Medication List        Accurate as of 01/05/18  2:34 PM. Always use your most recent med list.          calcium carbonate 500 MG chewable tablet Commonly known as:  TUMS - dosed in mg elemental calcium Chew 1-2 tablets by mouth daily as needed for indigestion or heartburn.   carboxymethylcellulose 1 % ophthalmic solution Apply 1 drop to eye 3 (three) times daily as needed (for dry/itchy eyes). Thera Tears.   desonide 0.05 % cream Commonly known as:  DESOWEN Apply 1 application topically 2 (two) times daily as needed (for dry/scaly areas in scalp).   ezetimibe 10 MG tablet Commonly known as:  ZETIA Take 10 mg by mouth daily.   fluticasone 50 MCG/ACT nasal spray Commonly known as:  FLONASE Place 1 spray into both nostrils 2 (two) times daily as needed (for cold/allergy symptoms.).   folic acid 416 MCG tablet Commonly known as:  FOLVITE Take 400 mcg by mouth daily.   L-Lysine 500 MG Caps Take 500 mg by mouth daily as needed (for fever blisters/cold sores.).   loratadine 10 MG tablet Commonly known as:  CLARITIN Take 10 mg by mouth daily as  needed (for upper respiratory issues).   ondansetron 4 MG disintegrating tablet Commonly known as:  ZOFRAN ODT Take 1 tablet (4 mg total) by mouth every 8 (eight) hours as needed for nausea or vomiting.   oxyCODONE-acetaminophen 5-325 MG tablet Commonly known as:  PERCOCET/ROXICET Take 2 tablets by mouth every 4 (four) hours as needed for severe pain.   potassium citrate 10 MEQ (1080 MG) SR tablet Commonly known as:  UROCIT-K Take 1 tablet (10 mEq total) by mouth 2 (two) times daily.   tamsulosin 0.4 MG Caps capsule Commonly known as:  FLOMAX Take 1 capsule (0.4 mg total) by mouth daily.   VOLTAREN 1 % Gel Generic  drug:  diclofenac sodium Apply 1 application topically 4 (four) times daily as needed. For pain/inflammation       Allergies:  Allergies  Allergen Reactions  . Atorvastatin     Other reaction(s): Muscle Pain  . Escitalopram     Other reaction(s): Other (See Comments) Sedation  . Ezetimibe-Simvastatin Diarrhea    Other reaction(s): Muscle Pain Vytorin 10-20  . Penicillins Hives  . Rosuvastatin     Other reaction(s): Muscle Pain    Family History: History reviewed. No pertinent family history.  Social History:  reports that he has never smoked. He has never used smokeless tobacco. He reports that he drinks alcohol. He reports that he does not use drugs.  ROS: UROLOGY Frequent Urination?: No Hard to postpone urination?: No Burning/pain with urination?: No Get up at night to urinate?: No Leakage of urine?: No Urine stream starts and stops?: No Trouble starting stream?: No Do you have to strain to urinate?: No Blood in urine?: No Urinary tract infection?: No Sexually transmitted disease?: No Injury to kidneys or bladder?: No Painful intercourse?: No Weak stream?: No Erection problems?: No Penile pain?: No  Gastrointestinal Nausea?: Yes Vomiting?: No Indigestion/heartburn?: No Diarrhea?: No Constipation?: Yes  Constitutional Fever: No Night sweats?: No Weight loss?: No Fatigue?: No  Skin Skin rash/lesions?: No Itching?: No  Eyes Blurred vision?: No Double vision?: No  Ears/Nose/Throat Sore throat?: No Sinus problems?: No  Hematologic/Lymphatic Swollen glands?: No Easy bruising?: No  Cardiovascular Leg swelling?: No Chest pain?: No  Respiratory Cough?: No Shortness of breath?: No  Endocrine Excessive thirst?: No  Musculoskeletal Back pain?: No Joint pain?: No  Neurological Headaches?: No Dizziness?: No  Psychologic Depression?: No Anxiety?: No  Physical Exam: BP (!) 173/79   Pulse 86   Resp 16   Ht 5\' 9"  (1.753 m)   Wt  168 lb 8 oz (76.4 kg)   SpO2 96%   BMI 24.88 kg/m   Constitutional:  Alert and oriented, No acute distress. HEENT: [place] AT, moist mucus membranes.  Trachea midline, no masses. Cardiovascular: No clubbing, cyanosis, or edema.  RRR Respiratory: Normal respiratory effort, no increased work of breathing.  Lungs clear GI: Abdomen is soft, nontender, nondistended, no abdominal masses GU: No CVA tenderness Lymph: No cervical or inguinal lymphadenopathy. Skin: No rashes, bruises or suspicious lesions. Neurologic: Grossly intact, no focal deficits, moving all 4 extremities. Psychiatric: Normal mood and affect.  Laboratory Data:   Urinalysis Dipstick trace blood; microscopy 0-2 RBC   Pertinent Imaging: CT was personally reviewed.  Stone density approximately 1200 HU  Assessment & Plan:   82 year old male with left renal colic secondary to an 8 mm left proximal ureteral calculus.  We did discuss based on stone size and location it is unlikely this will pass spontaneously.  He has had previous successful  shockwave lithotripsy and desired to schedule.  He was informed based on stone density shockwave lithotripsy may be less successful and there is no guarantee the stone will fragment and could require repeat treatment or alternative treatment such as ureteroscopy.  The alternative of ureteroscopy was discussed in detail.  He would like to schedule shockwave lithotripsy.  The indications and nature of the planned procedure were discussed as well as the potential benefits and expected outcome.  Alternatives were discussed as described above.  The most common complications and side effects were discussed as outlined in the [facility] consent form.  It was stressed that there is no guarantee that lithotripsy will be successful and he could require retreatment or alternative treatment.  The rare instance of perirenal bleeding requiring hospitalization, transfusion and rarely surgery were  discussed.  The possibility of renal colic from obstructing stone fragments requiring stent placement or ureteroscopy was also discussed.  He indicated all questions were answered to his satisfaction and desires to proceed.   Barry Pace, Barry Pace [address], [place], [place] [address] [phone]

## 2018-01-05 NOTE — Progress Notes (Signed)
01/05/2018 2:34 PM   Barry Pace June 17, 1936 [id number]  Referring provider: No referring provider defined for this encounter.  Chief Complaint  Patient presents with  . Nephrolithiasis    HPI: Barry Pace is an 82 year old male who presents for evaluation of the left renal colic.  He has a history of recurrent calcium oxalate nephrolithiasis and has had several previous ESWLs the last in August 2011.  He has been on potassium citrate for several years.  He was last seen in May 2019 and KUB showed bilateral, nonobstructing renal calculi with a 9 mm left lower pole calculus.  On 7/14 he had onset of severe left lower quadrant abdominal pain associated with nausea and dry heaves. Severity was rated 10/10 without identifiable precipitating, aggravating or alleviating factors.  He denied fever or chills.  He presented to the ED on the a.m. of 7/15 a stone protocol CT of the abdomen and pelvis showed a 5 x 8 mm left proximal ureteral calculus with moderate hydronephrosis/hydroureter and bilateral, nonobstructing renal calculi.  Since his ED visit his pain has been controlled with oxycodone and has been intermittent.   PMH: Past Medical History:  Diagnosis Date  . Anemia   . Arthritis   . Cancer (HCC)    Pre-cancerous skin cells  . GERD (gastroesophageal reflux disease)   . History of BPH   . History of kidney stones   . Hyperlipidemia   . PUD (peptic ulcer disease)     Surgical History: Past Surgical History:  Procedure Laterality Date  . APPENDECTOMY    . EYE SURGERY Bilateral    Cataract Extraction with IOL  . HERNIA REPAIR Right 1999   Inguinal Hernia Repair  . INGUINAL HERNIA REPAIR Left 06/19/2016   Procedure: HERNIA REPAIR INGUINAL ADULT;  Surgeon: Leonie Green, MD;  Location: [facility] ORS;  Service: General;  Laterality: Left;    Home Medications:  Allergies as of 01/05/2018      Reactions   Atorvastatin    Other reaction(s): Muscle Pain   Escitalopram     Other reaction(s): Other (See Comments) Sedation   Ezetimibe-simvastatin Diarrhea   Other reaction(s): Muscle Pain Vytorin 10-20   Penicillins Hives   Rosuvastatin    Other reaction(s): Muscle Pain      Medication List        Accurate as of 01/05/18  2:34 PM. Always use your most recent med list.          calcium carbonate 500 MG chewable tablet Commonly known as:  TUMS - dosed in mg elemental calcium Chew 1-2 tablets by mouth daily as needed for indigestion or heartburn.   carboxymethylcellulose 1 % ophthalmic solution Apply 1 drop to eye 3 (three) times daily as needed (for dry/itchy eyes). Thera Tears.   desonide 0.05 % cream Commonly known as:  DESOWEN Apply 1 application topically 2 (two) times daily as needed (for dry/scaly areas in scalp).   ezetimibe 10 MG tablet Commonly known as:  ZETIA Take 10 mg by mouth daily.   fluticasone 50 MCG/ACT nasal spray Commonly known as:  FLONASE Place 1 spray into both nostrils 2 (two) times daily as needed (for cold/allergy symptoms.).   folic acid 433 MCG tablet Commonly known as:  FOLVITE Take 400 mcg by mouth daily.   L-Lysine 500 MG Caps Take 500 mg by mouth daily as needed (for fever blisters/cold sores.).   loratadine 10 MG tablet Commonly known as:  CLARITIN Take 10 mg by mouth daily as  needed (for upper respiratory issues).   ondansetron 4 MG disintegrating tablet Commonly known as:  ZOFRAN ODT Take 1 tablet (4 mg total) by mouth every 8 (eight) hours as needed for nausea or vomiting.   oxyCODONE-acetaminophen 5-325 MG tablet Commonly known as:  PERCOCET/ROXICET Take 2 tablets by mouth every 4 (four) hours as needed for severe pain.   potassium citrate 10 MEQ (1080 MG) SR tablet Commonly known as:  UROCIT-K Take 1 tablet (10 mEq total) by mouth 2 (two) times daily.   tamsulosin 0.4 MG Caps capsule Commonly known as:  FLOMAX Take 1 capsule (0.4 mg total) by mouth daily.   VOLTAREN 1 % Gel Generic  drug:  diclofenac sodium Apply 1 application topically 4 (four) times daily as needed. For pain/inflammation       Allergies:  Allergies  Allergen Reactions  . Atorvastatin     Other reaction(s): Muscle Pain  . Escitalopram     Other reaction(s): Other (See Comments) Sedation  . Ezetimibe-Simvastatin Diarrhea    Other reaction(s): Muscle Pain Vytorin 10-20  . Penicillins Hives  . Rosuvastatin     Other reaction(s): Muscle Pain    Family History: History reviewed. No pertinent family history.  Social History:  reports that he has never smoked. He has never used smokeless tobacco. He reports that he drinks alcohol. He reports that he does not use drugs.  ROS: UROLOGY Frequent Urination?: No Hard to postpone urination?: No Burning/pain with urination?: No Get up at night to urinate?: No Leakage of urine?: No Urine stream starts and stops?: No Trouble starting stream?: No Do you have to strain to urinate?: No Blood in urine?: No Urinary tract infection?: No Sexually transmitted disease?: No Injury to kidneys or bladder?: No Painful intercourse?: No Weak stream?: No Erection problems?: No Penile pain?: No  Gastrointestinal Nausea?: Yes Vomiting?: No Indigestion/heartburn?: No Diarrhea?: No Constipation?: Yes  Constitutional Fever: No Night sweats?: No Weight loss?: No Fatigue?: No  Skin Skin rash/lesions?: No Itching?: No  Eyes Blurred vision?: No Double vision?: No  Ears/Nose/Throat Sore throat?: No Sinus problems?: No  Hematologic/Lymphatic Swollen glands?: No Easy bruising?: No  Cardiovascular Leg swelling?: No Chest pain?: No  Respiratory Cough?: No Shortness of breath?: No  Endocrine Excessive thirst?: No  Musculoskeletal Back pain?: No Joint pain?: No  Neurological Headaches?: No Dizziness?: No  Psychologic Depression?: No Anxiety?: No  Physical Exam: BP (!) 173/79   Pulse 86   Resp 16   Ht 5\' 9"  (1.753 m)   Wt  168 lb 8 oz (76.4 kg)   SpO2 96%   BMI 24.88 kg/m   Constitutional:  Alert and oriented, No acute distress. HEENT: [place] AT, moist mucus membranes.  Trachea midline, no masses. Cardiovascular: No clubbing, cyanosis, or edema.  RRR Respiratory: Normal respiratory effort, no increased work of breathing.  Lungs clear GI: Abdomen is soft, nontender, nondistended, no abdominal masses GU: No CVA tenderness Lymph: No cervical or inguinal lymphadenopathy. Skin: No rashes, bruises or suspicious lesions. Neurologic: Grossly intact, no focal deficits, moving all 4 extremities. Psychiatric: Normal mood and affect.  Laboratory Data:   Urinalysis Dipstick trace blood; microscopy 0-2 RBC   Pertinent Imaging: CT was personally reviewed.  Stone density approximately 1200 HU  Assessment & Plan:   82 year old male with left renal colic secondary to an 8 mm left proximal ureteral calculus.  We did discuss based on stone size and location it is unlikely this will pass spontaneously.  He has had previous successful  shockwave lithotripsy and desired to schedule.  He was informed based on stone density shockwave lithotripsy may be less successful and there is no guarantee the stone will fragment and could require repeat treatment or alternative treatment such as ureteroscopy.  The alternative of ureteroscopy was discussed in detail.  He would like to schedule shockwave lithotripsy.  The indications and nature of the planned procedure were discussed as well as the potential benefits and expected outcome.  Alternatives were discussed as described above.  The most common complications and side effects were discussed as outlined in the [facility] consent form.  It was stressed that there is no guarantee that lithotripsy will be successful and he could require retreatment or alternative treatment.  The rare instance of perirenal bleeding requiring hospitalization, transfusion and rarely surgery were  discussed.  The possibility of renal colic from obstructing stone fragments requiring stent placement or ureteroscopy was also discussed.  He indicated all questions were answered to his satisfaction and desires to proceed.   Abbie Sons, [place] [address], [place], [place] [address] [phone]

## 2018-01-06 ENCOUNTER — Other Ambulatory Visit: Payer: Self-pay

## 2018-01-06 ENCOUNTER — Ambulatory Visit
Admission: RE | Admit: 2018-01-06 | Discharge: 2018-01-06 | Disposition: A | Discharge status: Home / Self Care | Payer: Medicare Other | Source: Ambulatory Visit | Attending: Urology | Admitting: Urology

## 2018-01-06 ENCOUNTER — Ambulatory Visit: Payer: Medicare Other

## 2018-01-06 ENCOUNTER — Encounter
Admission: RE | Disposition: A | Discharge status: Home / Self Care | Payer: Self-pay | Source: Ambulatory Visit | Attending: Urology

## 2018-01-06 DIAGNOSIS — Z8711 Personal history of peptic ulcer disease: Secondary | ICD-10-CM | POA: Insufficient documentation

## 2018-01-06 DIAGNOSIS — N200 Calculus of kidney: Secondary | ICD-10-CM | POA: Diagnosis present

## 2018-01-06 DIAGNOSIS — Z79899 Other long term (current) drug therapy: Secondary | ICD-10-CM | POA: Insufficient documentation

## 2018-01-06 DIAGNOSIS — Z880 Allergy status to penicillin: Secondary | ICD-10-CM | POA: Diagnosis not present

## 2018-01-06 DIAGNOSIS — Z87442 Personal history of urinary calculi: Secondary | ICD-10-CM | POA: Diagnosis not present

## 2018-01-06 DIAGNOSIS — K219 Gastro-esophageal reflux disease without esophagitis: Secondary | ICD-10-CM | POA: Diagnosis not present

## 2018-01-06 DIAGNOSIS — Z791 Long term (current) use of non-steroidal anti-inflammatories (NSAID): Secondary | ICD-10-CM | POA: Diagnosis not present

## 2018-01-06 DIAGNOSIS — Z79891 Long term (current) use of opiate analgesic: Secondary | ICD-10-CM | POA: Insufficient documentation

## 2018-01-06 DIAGNOSIS — N201 Calculus of ureter: Secondary | ICD-10-CM

## 2018-01-06 DIAGNOSIS — N132 Hydronephrosis with renal and ureteral calculous obstruction: Secondary | ICD-10-CM | POA: Diagnosis not present

## 2018-01-06 DIAGNOSIS — N400 Enlarged prostate without lower urinary tract symptoms: Secondary | ICD-10-CM | POA: Insufficient documentation

## 2018-01-06 DIAGNOSIS — Z888 Allergy status to other drugs, medicaments and biological substances status: Secondary | ICD-10-CM | POA: Diagnosis not present

## 2018-01-06 DIAGNOSIS — Z7951 Long term (current) use of inhaled steroids: Secondary | ICD-10-CM | POA: Insufficient documentation

## 2018-01-06 DIAGNOSIS — E785 Hyperlipidemia, unspecified: Secondary | ICD-10-CM | POA: Insufficient documentation

## 2018-01-06 HISTORY — PX: EXTRACORPOREAL SHOCK WAVE LITHOTRIPSY: SHX1557

## 2018-01-06 SURGERY — LITHOTRIPSY, ESWL
Anesthesia: Moderate Sedation | Laterality: Left

## 2018-01-06 MED ORDER — ONDANSETRON HCL 4 MG/2ML IJ SOLN
INTRAMUSCULAR | Status: AC
  Administered 2018-01-06: 4 mg via INTRAVENOUS
  Filled 2018-01-06: qty 2

## 2018-01-06 MED ORDER — ONDANSETRON HCL 4 MG/2ML IJ SOLN
4.0000 mg | Freq: Once | INTRAMUSCULAR | Status: AC | PRN
  Administered 2018-01-06: 4 mg via INTRAVENOUS

## 2018-01-06 MED ORDER — CIPROFLOXACIN HCL 500 MG PO TABS
500.0000 mg | ORAL_TABLET | ORAL | Status: AC
  Administered 2018-01-06: 500 mg via ORAL

## 2018-01-06 MED ORDER — DIAZEPAM 5 MG PO TABS
ORAL_TABLET | ORAL | Status: AC
  Administered 2018-01-06: 10 mg via ORAL
  Filled 2018-01-06: qty 2

## 2018-01-06 MED ORDER — DIAZEPAM 5 MG PO TABS
10.0000 mg | ORAL_TABLET | ORAL | Status: AC
  Administered 2018-01-06: 10 mg via ORAL

## 2018-01-06 MED ORDER — TAMSULOSIN HCL 0.4 MG PO CAPS: 0.4000 mg | ORAL_CAPSULE | Freq: Every day | ORAL | 0 refills | Status: DC

## 2018-01-06 MED ORDER — OXYCODONE-ACETAMINOPHEN 5-325 MG PO TABS: 2.0000 | ORAL_TABLET | ORAL | 0 refills | Status: DC | PRN

## 2018-01-06 MED ORDER — DIPHENHYDRAMINE HCL 25 MG PO CAPS
ORAL_CAPSULE | ORAL | Status: AC
  Administered 2018-01-06: 25 mg via ORAL
  Filled 2018-01-06: qty 1

## 2018-01-06 MED ORDER — SODIUM CHLORIDE 0.9 % IV SOLN
INTRAVENOUS | Status: DC
  Administered 2018-01-06: 07:00:00 via INTRAVENOUS

## 2018-01-06 MED ORDER — CIPROFLOXACIN HCL 500 MG PO TABS
ORAL_TABLET | ORAL | Status: AC
  Administered 2018-01-06: 500 mg via ORAL
  Filled 2018-01-06: qty 1

## 2018-01-06 MED ORDER — DIPHENHYDRAMINE HCL 25 MG PO CAPS
25.0000 mg | ORAL_CAPSULE | ORAL | Status: AC
  Administered 2018-01-06: 25 mg via ORAL

## 2018-01-06 NOTE — Interval H&P Note (Signed)
History and Physical Interval Note:  01/06/2018 9:08 AM  Barry Pace  has presented today for surgery, with the diagnosis of Kidney stone  The various methods of treatment have been discussed with the patient and family. After consideration of risks, benefits and other options for treatment, the patient has consented to  Procedure(s): EXTRACORPOREAL SHOCK WAVE LITHOTRIPSY (ESWL) (Left) as a surgical intervention .  The patient's history has been reviewed, patient examined, no change in status, stable for surgery.  I have reviewed the patient's chart and labs.  Questions were answered to the patient's satisfaction.     Hollice Espy

## 2018-01-06 NOTE — Discharge Instructions (Signed)
See [facility] discharge instructions in chart.  

## 2018-01-06 NOTE — OR Nursing (Signed)
Transferred to [employer] truck via wheelchair

## 2018-01-13 ENCOUNTER — Telehealth: Payer: Self-pay | Admitting: Urology

## 2018-01-13 MED ORDER — TAMSULOSIN HCL 0.4 MG PO CAPS: 0.4000 mg | ORAL_CAPSULE | Freq: Every day | ORAL | 0 refills | Status: DC

## 2018-01-13 NOTE — Telephone Encounter (Signed)
Barry Pace did litho last week.  Pt has taken all of Flomax that was prescribed in hospital.  He is finished with what the hospital gave him and wants to know if he should get other Rx filled or if he had taken them long enough.  Please call pt [phone]

## 2018-01-13 NOTE — Addendum Note (Signed)
Addended by: Garnette Gunner on: 01/13/2018 03:54 PM   Modules accepted: Orders

## 2018-01-18 ENCOUNTER — Ambulatory Visit (INDEPENDENT_AMBULATORY_CARE_PROVIDER_SITE_OTHER): Payer: Medicare Other | Admitting: Urology

## 2018-01-18 ENCOUNTER — Other Ambulatory Visit: Payer: Self-pay | Admitting: Radiology

## 2018-01-18 ENCOUNTER — Encounter: Payer: Self-pay | Admitting: Urology

## 2018-01-18 ENCOUNTER — Other Ambulatory Visit: Payer: Self-pay

## 2018-01-18 ENCOUNTER — Ambulatory Visit
Admission: RE | Admit: 2018-01-18 | Discharge: 2018-01-18 | Disposition: A | Discharge status: Home / Self Care | Payer: Medicare Other | Source: Ambulatory Visit | Attending: Urology | Admitting: Urology

## 2018-01-18 VITALS — BP 129/85 | HR 101 | Ht 69.0 in | Wt 164.8 lb

## 2018-01-18 DIAGNOSIS — N200 Calculus of kidney: Secondary | ICD-10-CM

## 2018-01-18 DIAGNOSIS — N201 Calculus of ureter: Secondary | ICD-10-CM

## 2018-01-18 NOTE — Progress Notes (Signed)
01/18/2018 2:06 PM   [name] 1935-08-04 [id number]  Referring provider: No referring provider defined for this encounter.  Chief Complaint  Patient presents with  . Routine Post Op    HPI: 82 year old male status post shockwave lithotripsy of a 5 x 8 mm left proximal ureteral calculus on 01/06/2018.  He had no post procedure problems and has no complaints today.  Denies flank, abdominal, pelvic or scrotal pain.  He has no bothersome lower urinary tract symptoms.  He brings in several stone fragments that he passed.   KUB performed today was reviewed and the previously identified left proximal ureteral calculus is no longer seen.  No fragments are identified along the expected course of the ureter.  PMH: Past Medical History:  Diagnosis Date  . Anemia   . Arthritis   . Cancer (HCC)    Pre-cancerous skin cells  . GERD (gastroesophageal reflux disease)   . History of BPH   . History of kidney stones   . Hyperlipidemia   . PUD (peptic ulcer disease)     Surgical History: Past Surgical History:  Procedure Laterality Date  . APPENDECTOMY    . EXTRACORPOREAL SHOCK WAVE LITHOTRIPSY Left 01/06/2018   Procedure: EXTRACORPOREAL SHOCK WAVE LITHOTRIPSY (ESWL);  Surgeon: Hollice Espy, MD;  Location: [facility] ORS;  Service: Urology;  Laterality: Left;  . EYE SURGERY Bilateral    Cataract Extraction with IOL  . HERNIA REPAIR Right 1999   Inguinal Hernia Repair  . INGUINAL HERNIA REPAIR Left 06/19/2016   Procedure: HERNIA REPAIR INGUINAL ADULT;  Surgeon: Leonie Green, MD;  Location: [facility] ORS;  Service: General;  Laterality: Left;    Home Medications:  Allergies as of 01/18/2018      Reactions   Atorvastatin    Other reaction(s): Muscle Pain   Escitalopram    Other reaction(s): Other (See Comments) Sedation   Ezetimibe-simvastatin Diarrhea   Other reaction(s): Muscle Pain Vytorin 10-20   Penicillins Hives   Rosuvastatin    Other reaction(s): Muscle Pain        Medication List        Accurate as of 01/18/18  2:06 PM. Always use your most recent med list.          calcium carbonate 500 MG chewable tablet Commonly known as:  TUMS - dosed in mg elemental calcium Chew 1-2 tablets by mouth daily as needed for indigestion or heartburn.   carboxymethylcellulose 1 % ophthalmic solution Apply 1 drop to eye 3 (three) times daily as needed (for dry/itchy eyes). Thera Tears.   desonide 0.05 % cream Commonly known as:  DESOWEN Apply 1 application topically 2 (two) times daily as needed (for dry/scaly areas in scalp).   ezetimibe 10 MG tablet Commonly known as:  ZETIA Take 10 mg by mouth daily.   fluticasone 50 MCG/ACT nasal spray Commonly known as:  FLONASE Place 1 spray into both nostrils 2 (two) times daily as needed (for cold/allergy symptoms.).   folic acid 646 MCG tablet Commonly known as:  FOLVITE Take 400 mcg by mouth daily.   L-Lysine 500 MG Caps Take 500 mg by mouth daily as needed (for fever blisters/cold sores.).   loratadine 10 MG tablet Commonly known as:  CLARITIN Take 10 mg by mouth daily as needed (for upper respiratory issues).   potassium citrate 10 MEQ (1080 MG) SR tablet Commonly known as:  UROCIT-K Take 1 tablet (10 mEq total) by mouth 2 (two) times daily.   VOLTAREN 1 % Gel  Generic drug:  diclofenac sodium Apply 1 application topically 4 (four) times daily as needed. For pain/inflammation       Allergies:  Allergies  Allergen Reactions  . Atorvastatin     Other reaction(s): Muscle Pain  . Escitalopram     Other reaction(s): Other (See Comments) Sedation  . Ezetimibe-Simvastatin Diarrhea    Other reaction(s): Muscle Pain Vytorin 10-20  . Penicillins Hives  . Rosuvastatin     Other reaction(s): Muscle Pain    Family History: History reviewed. No pertinent family history.  Social History:  reports that he has never smoked. He has never used smokeless tobacco. He reports that he drinks alcohol. He  reports that he does not use drugs.  ROS: UROLOGY Frequent Urination?: No Hard to postpone urination?: No Burning/pain with urination?: No Get up at night to urinate?: No Leakage of urine?: No Urine stream starts and stops?: No Trouble starting stream?: No Do you have to strain to urinate?: No Blood in urine?: No Urinary tract infection?: No Sexually transmitted disease?: No Injury to kidneys or bladder?: No Painful intercourse?: No Weak stream?: No Erection problems?: No Penile pain?: No  Gastrointestinal Nausea?: No Vomiting?: No Indigestion/heartburn?: No Diarrhea?: No Constipation?: No  Constitutional Fever: No Night sweats?: No Weight loss?: No Fatigue?: No  Skin Skin rash/lesions?: No Itching?: No  Eyes Blurred vision?: No Double vision?: No  Ears/Nose/Throat Sore throat?: No Sinus problems?: No  Hematologic/Lymphatic Swollen glands?: No Easy bruising?: No  Cardiovascular Leg swelling?: No Chest pain?: No  Respiratory Cough?: No Shortness of breath?: No  Endocrine Excessive thirst?: No  Musculoskeletal Back pain?: No Joint pain?: No  Neurological Headaches?: No Dizziness?: No  Psychologic Depression?: No Anxiety?: No  Physical Exam: BP 129/85 (BP Location: Left Arm, Patient Position: Sitting, Cuff Size: Normal)   Pulse (!) 101   Ht 5\' 9"  (1.753 m)   Wt 164 lb 12.8 oz (74.8 kg)   BMI 24.34 kg/m   Constitutional:  Alert and oriented, No acute distress.  Pertinent Imaging: KUB performed today was personally reviewed   Assessment & Plan:   Prior calculi analyzed are calcium oxalate in his fragments were not sent.  Schedule follow-up ultrasound in approximately 4 weeks.  He will be notified with the results.  He will keep his regular scheduled follow-up and was instructed to call earlier for recurrent renal colic.    Abbie Sons, [place] [address], [place], [place]  [address] [phone]

## 2018-01-20 ENCOUNTER — Telehealth: Payer: Self-pay | Admitting: Urology

## 2018-01-20 DIAGNOSIS — N200 Calculus of kidney: Secondary | ICD-10-CM

## 2018-01-20 NOTE — Telephone Encounter (Signed)
You wanted this patient to have a RUS in 4 weeks, can you put the order on for me please and thank you?  Sharyn Lull

## 2018-01-20 NOTE — Telephone Encounter (Signed)
Order entered

## 2018-02-01 ENCOUNTER — Ambulatory Visit
Admission: RE | Admit: 2018-02-01 | Discharge: 2018-02-01 | Disposition: A | Discharge status: Home / Self Care | Payer: Medicare Other | Source: Ambulatory Visit | Attending: Urology | Admitting: Urology

## 2018-02-01 DIAGNOSIS — Z9889 Other specified postprocedural states: Secondary | ICD-10-CM | POA: Insufficient documentation

## 2018-02-01 DIAGNOSIS — N281 Cyst of kidney, acquired: Secondary | ICD-10-CM | POA: Insufficient documentation

## 2018-02-01 DIAGNOSIS — N200 Calculus of kidney: Secondary | ICD-10-CM | POA: Insufficient documentation

## 2018-02-01 DIAGNOSIS — N400 Enlarged prostate without lower urinary tract symptoms: Secondary | ICD-10-CM | POA: Diagnosis not present

## 2018-02-07 ENCOUNTER — Telehealth: Payer: Self-pay

## 2018-02-07 NOTE — Telephone Encounter (Signed)
-----   Message from Abbie Sons, MD sent at 02/06/2018 10:35 AM EDT ----- Renal ultrasound showed resolution of left kidney blockage

## 2018-02-07 NOTE — Telephone Encounter (Signed)
Called pt informed him of information below, pt gave verbal understanding.

## 2018-11-02 ENCOUNTER — Other Ambulatory Visit: Payer: Self-pay

## 2018-11-02 ENCOUNTER — Ambulatory Visit
Admission: RE | Admit: 2018-11-02 | Discharge: 2018-11-02 | Disposition: A | Discharge status: Home / Self Care | Payer: Medicare Other | Source: Ambulatory Visit | Attending: Urology | Admitting: Urology

## 2018-11-02 ENCOUNTER — Ambulatory Visit
Admission: RE | Admit: 2018-11-02 | Discharge: 2018-11-02 | Disposition: A | Discharge status: Home / Self Care | Payer: Medicare Other | Attending: Urology | Admitting: Urology

## 2018-11-02 DIAGNOSIS — N200 Calculus of kidney: Secondary | ICD-10-CM | POA: Insufficient documentation

## 2018-11-02 DIAGNOSIS — N201 Calculus of ureter: Secondary | ICD-10-CM | POA: Insufficient documentation

## 2018-11-09 ENCOUNTER — Other Ambulatory Visit: Payer: Self-pay

## 2018-11-09 ENCOUNTER — Telehealth (INDEPENDENT_AMBULATORY_CARE_PROVIDER_SITE_OTHER): Payer: Medicare Other | Admitting: Urology

## 2018-11-09 DIAGNOSIS — N200 Calculus of kidney: Secondary | ICD-10-CM

## 2018-11-09 NOTE — Progress Notes (Signed)
Virtual Visit via Telephone Note  I connected with [name] on 11/09/18 at  2:00 PM EDT by telephone and verified that I am speaking with the correct person using two identifiers.  Location: Patient: Home Provider: Home   I discussed the limitations, risks, security and privacy concerns of performing an evaluation and management service by telephone and the availability of in person appointments. I also discussed with the patient that there may be a patient responsible charge related to this service. The patient expressed understanding and agreed to proceed.   History of Present Illness: 83 year old male for annual follow-up via telephone secondary to COVID-19 pandemic.  He has a history of recurrent stone disease and underwent shockwave lithotripsy of a 5 x 8 mm left proximal ureteral calculus in July 2019.  Follow-up imaging including KUB and renal ultrasound showed no residual fragments or hydronephrosis.  His preprocedure CT showed small, bilateral nonobstructing renal calculi however these were not visualized on KUB or ultrasound.  Since his last visit he states he has been doing well.  Denies flank, abdominal, pelvic or scrotal pain.  He has no bothersome lower urinary tract symptoms and denies gross hematuria.  KUB performed on 11/02/2018 was reviewed and no calcifications identified overlying the renal outlines.  There is a moderate amount of stool and bowel gas present.  Multiple pelvic phleboliths are present.  He remains on potassium citrate.   Observations/Objective:   Assessment and Plan: 83 year old male with a history of stone disease currently doing well.  Small renal calculi noted on CT are not visualized on KUB.  Recommend continuing potassium citrate and he was informed of the availability of a litho-lyte.  Follow Up Instructions: Follow-up 1 year with KUB.   I discussed the assessment and treatment plan with the patient. The patient was provided an opportunity  to ask questions and all were answered. The patient agreed with the plan and demonstrated an understanding of the instructions.   The patient was advised to call back or seek an in-person evaluation if the symptoms worsen or if the condition fails to improve as anticipated.  I provided 11 minutes of non-face-to-face time during this encounter.   Abbie Sons, MD

## 2019-01-04 ENCOUNTER — Other Ambulatory Visit: Payer: Self-pay

## 2019-01-04 DIAGNOSIS — N200 Calculus of kidney: Secondary | ICD-10-CM

## 2019-01-05 MED ORDER — POTASSIUM CITRATE ER 10 MEQ (1080 MG) PO TBCR: 10.0000 meq | EXTENDED_RELEASE_TABLET | Freq: Two times a day (BID) | ORAL | 11 refills | Status: DC

## 2019-11-05 IMAGING — MR MR HEAD W/O CM
10 series · 48 of 48 positions shown · non-contrast
Comparison: Prior CT from earlier the same day.

CLINICAL DATA: Initial evaluation for near syncopal episode,
dizziness.

EXAM:
MRI HEAD WITHOUT CONTRAST
TECHNIQUE: Multiplanar, multiecho pulse sequences of the brain and surrounding
structures were obtained without intravenous contrast.

[Series 2: T1 · sagittal · 5.0mm · 0.45mm/px · 3 of 29 slices shown (1 of 2)]
[im 1/29]
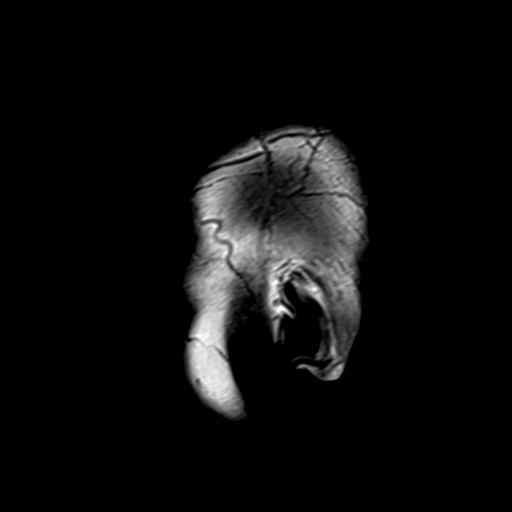
[im 15/29]
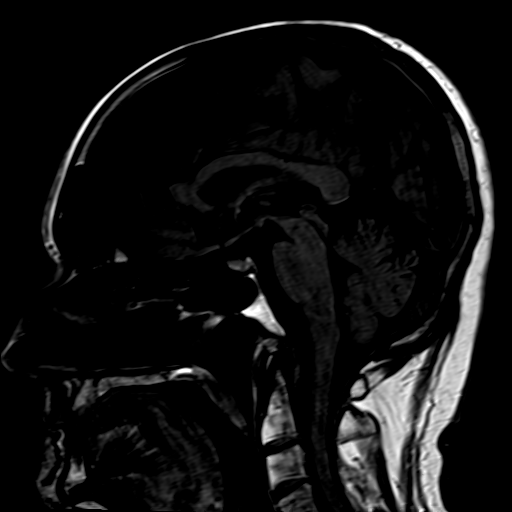
[im 29/29]
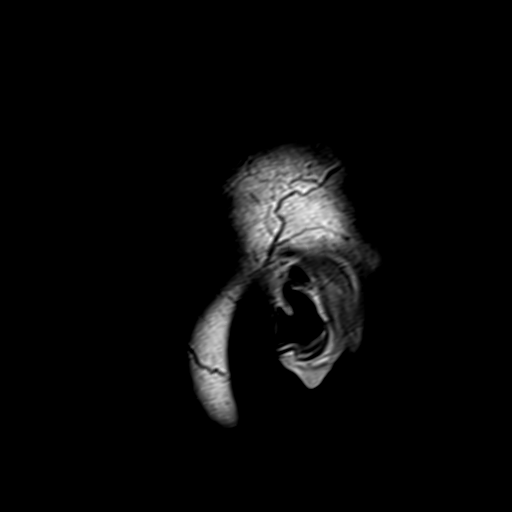

[Series 4: DWI · axial · 3.0mm · 1.80mm/px · z∈[-92,+69]mm · 6 of 55 slices shown (1 of 2)]
[im 1/55]
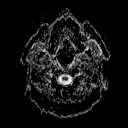
[im 11/55]
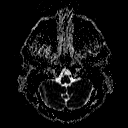
[im 22/55]
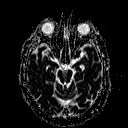
[im 33/55]
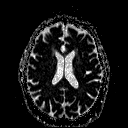
[im 44/55]
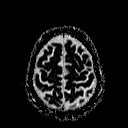
[im 55/55]
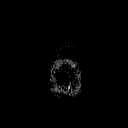

[Series 6: DWI · coronal · 3.0mm · 1.80mm/px · 6 of 51 slices shown (2 of 2)]
[im 1/51]
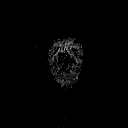
[im 11/51]
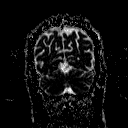
[im 21/51]
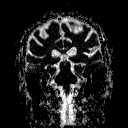
[im 31/51]
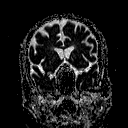
[im 41/51]
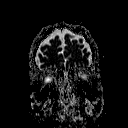
[im 51/51]
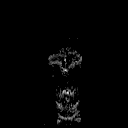

[Series 7: T2 · axial · 5.0mm · 0.90mm/px · z∈[-94,+74]mm · 3 of 27 slices shown (1 of 3)]
[im 1/27]
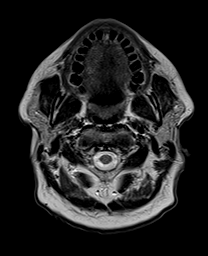
[im 14/27]
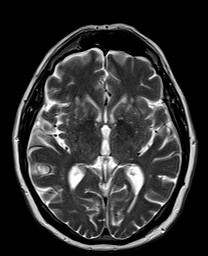
[im 27/27]
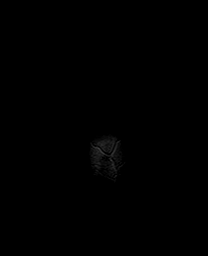

[Series 8: FLAIR · axial · 3.0mm · 0.45mm/px · z∈[-88,+68]mm · 6 of 53 slices shown]
[im 1/53]
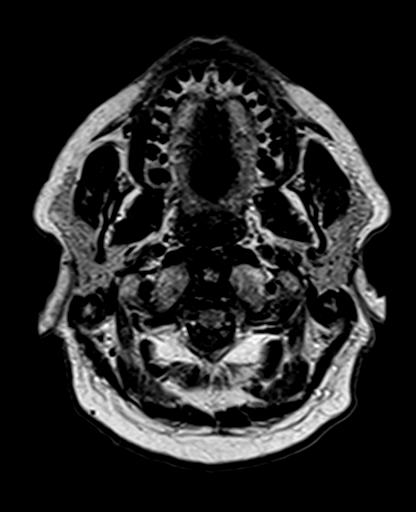
[im 11/53]
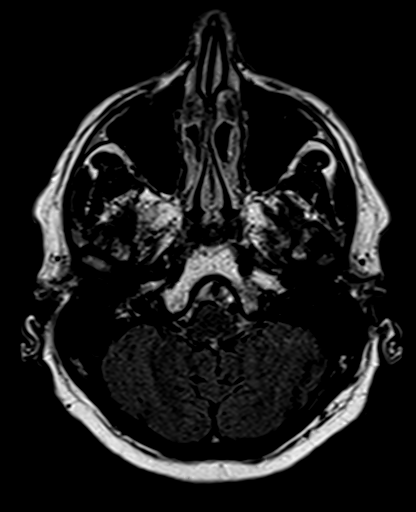
[im 21/53]
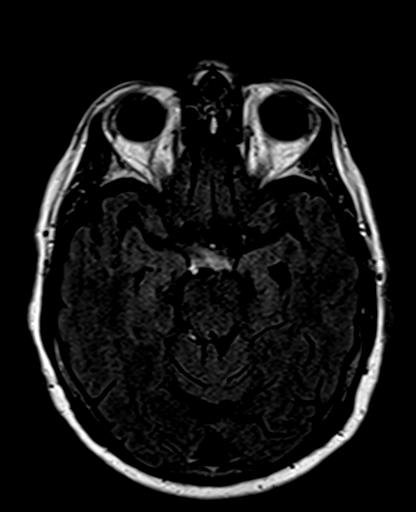
[im 32/53]
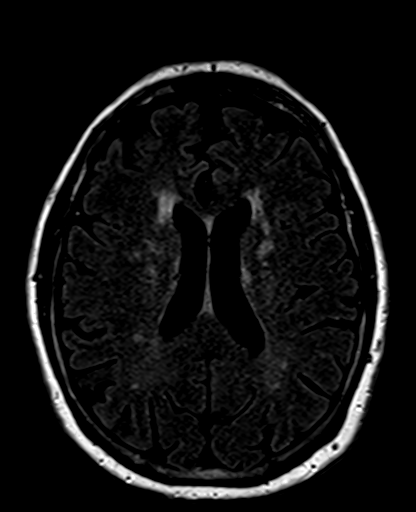
[im 42/53]
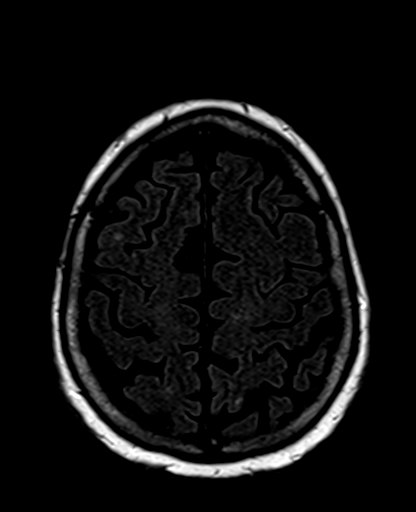
[im 53/53]
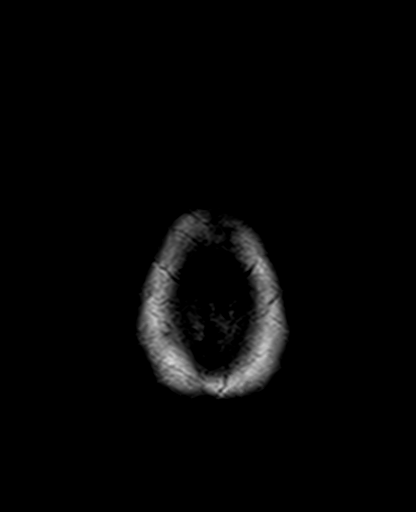

[Series 9: T2 · axial · 5.0mm · 0.45mm/px · z∈[-94,+74]mm · 3 of 27 slices shown (2 of 3)]
[im 1/27]
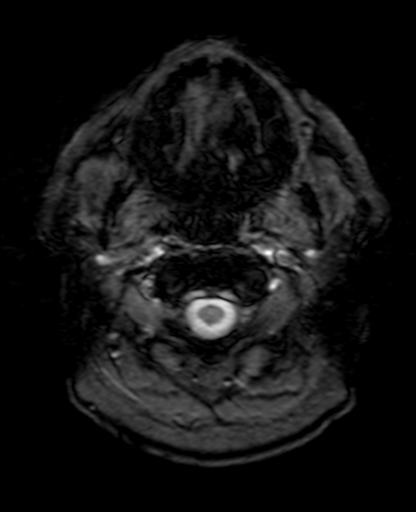
[im 14/27]
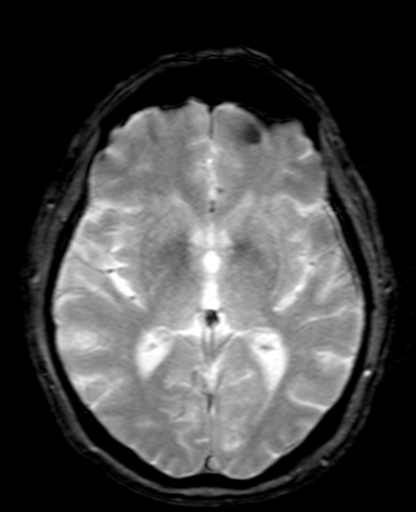
[im 27/27]
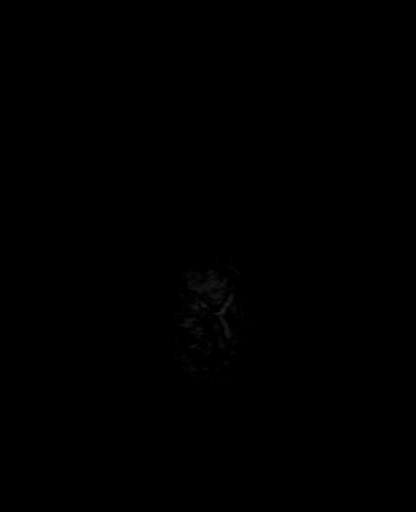

[Series 10: T1 · axial · 3.0mm · 1.00mm/px · z∈[-99,+77]mm · 7 of 60 slices shown (2 of 2)]
[im 1/60]
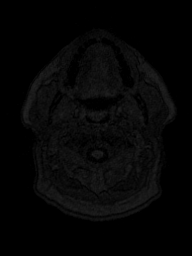
[im 10/60]
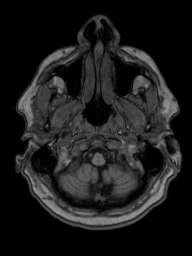
[im 20/60]
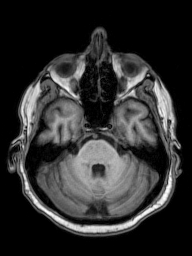
[im 30/60]
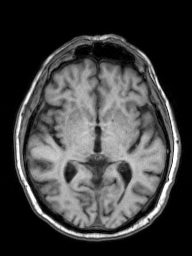
[im 40/60]
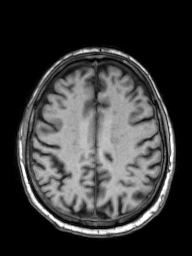
[im 50/60]
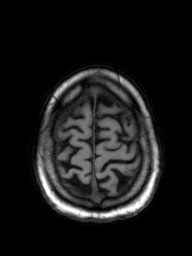
[im 60/60]
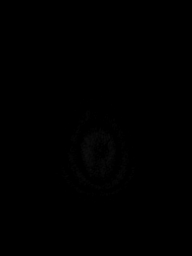

[Series 11: T2 · coronal · 5.0mm · 0.86mm/px · 3 of 31 slices shown (3 of 3)]
[im 1/31]
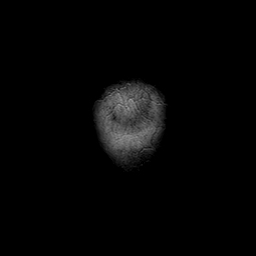
[im 16/31]
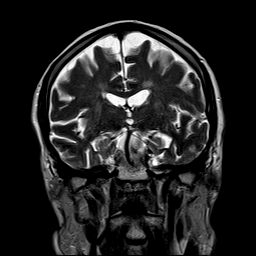
[im 31/31]
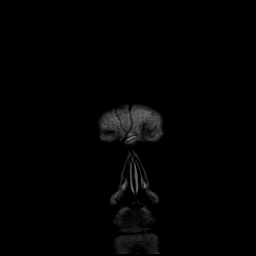

[Series 100: ax (id) · axial · 3.0mm · 1.80mm/px · z∈[-92,+69]mm · 6 of 55 slices shown]
[im 1/55]
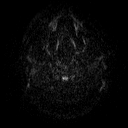
[im 11/55]
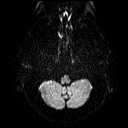
[im 22/55]
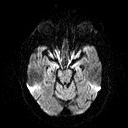
[im 33/55]
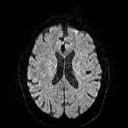
[im 44/55]
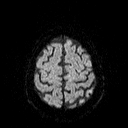
[im 55/55]
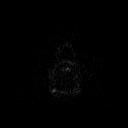

[Series 101: cor (id) · coronal · 3.0mm · 1.80mm/px · 5 of 50 slices shown]
[im 1/50]
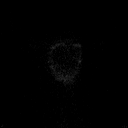
[im 13/50]
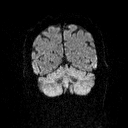
[im 25/50]
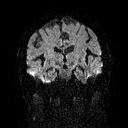
[im 37/50]
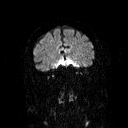
[im 50/50]
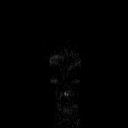

[48 of 48 positions shown; findings below may reference images not displayed]

FINDINGS: Brain: Cerebral volume within normal limits for age. Patchy T2/FLAIR
hyperintensity within the periventricular and deep white matter both
cerebral hemispheres, most consistent with chronic small vessel
ischemic change, mild in nature. Mild chronic small vessel ischemic
change present within the pons as well. Small remote lacunar infarct
present within the left thalamus.

No abnormal foci of restricted diffusion to suggest acute or
subacute ischemia. Gray-white matter differentiation maintained. No
other areas of remote infarction. No foci of susceptibility artifact
to suggest acute or chronic intracranial hemorrhage.

No mass lesion, midline shift or mass effect. No hydrocephalus. No
extra-axial fluid collection. Major dural sinuses are grossly
patent.

Pituitary gland suprasellar region normal. Midline structures intact
and normal.

Vascular: Major intracranial vascular flow voids are well
maintained.

Skull and upper cervical spine: Craniocervical junction normal.
Upper cervical spine normal. Bone marrow signal intensity within
normal limits. No scalp soft tissue abnormality.

Sinuses/Orbits: Globes and orbital soft tissues within normal
limits. Patient status post lens extraction bilaterally. Paranasal
sinuses are clear. No mastoid effusion. Inner ear structures normal.

Other: None.
IMPRESSION: 1. No acute intracranial abnormality.
2. Mild chronic small vessel ischemic disease for age with small
remote lacunar infarct within the left thalamus.

## 2019-11-10 ENCOUNTER — Encounter: Payer: Self-pay | Admitting: Urology

## 2019-11-10 ENCOUNTER — Ambulatory Visit
Admission: RE | Admit: 2019-11-10 | Discharge: 2019-11-10 | Disposition: A | Discharge status: Home / Self Care | Payer: Medicare Other | Attending: Urology | Admitting: Urology

## 2019-11-10 ENCOUNTER — Other Ambulatory Visit: Payer: Self-pay

## 2019-11-10 ENCOUNTER — Ambulatory Visit (INDEPENDENT_AMBULATORY_CARE_PROVIDER_SITE_OTHER): Payer: Medicare Other | Admitting: Urology

## 2019-11-10 ENCOUNTER — Ambulatory Visit
Admission: RE | Admit: 2019-11-10 | Discharge: 2019-11-10 | Disposition: A | Discharge status: Home / Self Care | Payer: Medicare Other | Source: Ambulatory Visit | Attending: Urology | Admitting: Urology

## 2019-11-10 VITALS — BP 172/81 | HR 64 | Ht 69.0 in | Wt 175.0 lb

## 2019-11-10 DIAGNOSIS — N200 Calculus of kidney: Secondary | ICD-10-CM | POA: Diagnosis present

## 2019-11-10 MED ORDER — POTASSIUM CITRATE ER 10 MEQ (1080 MG) PO TBCR: 10.0000 meq | EXTENDED_RELEASE_TABLET | Freq: Two times a day (BID) | ORAL | 6 refills | Status: DC

## 2019-11-10 NOTE — Progress Notes (Signed)
11/10/2019 9:40 AM   [name] May 05, 1936 [id number]  Referring provider: Idelle Crouch, MD [facility] [place],  [place] [address]  Chief Complaint  Patient presents with  . Nephrolithiasis    Urologic history: 1.  Nephrolithiasis -Prior shockwave lithotripsy, last 2019 -CT 2019 bilateral renal calculi -Potassium citrate  HPI: 84 y.o. male presents for follow-up of nephrolithiasis.  -Doing well since last visit -Denies flank, abdominal, pelvic pain -Mild LUTS, not bothersome -Denies dysuria, gross hematuria -Remains on potassium citrate   PMH: Past Medical History:  Diagnosis Date  . Anemia   . Arthritis   . Cancer (HCC)    Pre-cancerous skin cells  . GERD (gastroesophageal reflux disease)   . History of BPH   . History of kidney stones   . Hyperlipidemia   . PUD (peptic ulcer disease)     Surgical History: Past Surgical History:  Procedure Laterality Date  . APPENDECTOMY    . EXTRACORPOREAL SHOCK WAVE LITHOTRIPSY Left 01/06/2018   Procedure: EXTRACORPOREAL SHOCK WAVE LITHOTRIPSY (ESWL);  Surgeon: Hollice Espy, MD;  Location: [facility] ORS;  Service: Urology;  Laterality: Left;  . EYE SURGERY Bilateral    Cataract Extraction with IOL  . HERNIA REPAIR Right 1999   Inguinal Hernia Repair  . INGUINAL HERNIA REPAIR Left 06/19/2016   Procedure: HERNIA REPAIR INGUINAL ADULT;  Surgeon: Leonie Green, MD;  Location: [facility] ORS;  Service: General;  Laterality: Left;    Home Medications:  Allergies as of 11/10/2019      Reactions   Atorvastatin    Other reaction(s): Muscle Pain   Escitalopram    Other reaction(s): Other (See Comments) Sedation   Ezetimibe-simvastatin Diarrhea   Other reaction(s): Muscle Pain Vytorin 10-20   Penicillins Hives   Rosuvastatin    Other reaction(s): Muscle Pain      Medication List       Accurate as of Nov 10, 2019  9:40 AM. If you have any questions, ask your nurse or doctor.        amLODipine 5 MG tablet Commonly known as: NORVASC Take 5 mg by mouth at bedtime.   bisoprolol 5 MG tablet Commonly known as: ZEBETA Take 5 mg by mouth daily.   calcium carbonate 1250 (500 Ca) MG chewable tablet Commonly known as: OS-CAL Chew by mouth.   calcium carbonate 500 MG chewable tablet Commonly known as: TUMS - dosed in mg elemental calcium Chew 1-2 tablets by mouth daily as needed for indigestion or heartburn.   carboxymethylcellulose 1 % ophthalmic solution Apply 1 drop to eye 3 (three) times daily as needed (for dry/itchy eyes). Thera Tears.   desonide 0.05 % cream Commonly known as: DESOWEN Apply 1 application topically 2 (two) times daily as needed (for dry/scaly areas in scalp).   ezetimibe 10 MG tablet Commonly known as: ZETIA Take 10 mg by mouth daily.   fluticasone 50 MCG/ACT nasal spray Commonly known as: FLONASE Place 1 spray into both nostrils 2 (two) times daily as needed (for cold/allergy symptoms.).   folic acid [id number] MCG tablet Commonly known as: FOLVITE Take 400 mcg by mouth daily.   L-Lysine 500 MG Caps Take 500 mg by mouth daily as needed (for fever blisters/cold sores.).   levothyroxine 50 MCG tablet Commonly known as: SYNTHROID Take 50 mcg by mouth daily.   loratadine 10 MG tablet Commonly known as: CLARITIN Take 10 mg by mouth daily as needed (for upper respiratory issues).   meloxicam 7.5 MG  tablet Commonly known as: MOBIC Take 7.5 mg by mouth daily.   omeprazole 20 MG capsule Commonly known as: PRILOSEC Take 20 mg by mouth daily.   potassium citrate 10 MEQ (1080 MG) SR tablet Commonly known as: UROCIT-K Take 1 tablet (10 mEq total) by mouth 2 (two) times daily.   Voltaren 1 % Gel Generic drug: diclofenac Sodium Apply 1 application topically 4 (four) times daily as needed. For pain/inflammation       Allergies:  Allergies  Allergen Reactions  . Atorvastatin     Other reaction(s): Muscle Pain  . Escitalopram      Other reaction(s): Other (See Comments) Sedation  . Ezetimibe-Simvastatin Diarrhea    Other reaction(s): Muscle Pain Vytorin 10-20  . Penicillins Hives  . Rosuvastatin     Other reaction(s): Muscle Pain    Family History: No family history on file.  Social History:  reports that he has never smoked. He has never used smokeless tobacco. He reports current alcohol use. He reports that he does not use drugs.   Physical Exam: BP (!) 172/81   Pulse 64   Ht 5\' 9"  (1.753 m)   Wt 175 lb (79.4 kg)   BMI 25.84 kg/m   Constitutional:  Alert and oriented, No acute distress. HEENT: [place] AT, moist mucus membranes.  Trachea midline, no masses. Cardiovascular: No clubbing, cyanosis, or edema. Respiratory: Normal respiratory effort, no increased work of breathing. Skin: No rashes, bruises or suspicious lesions. Neurologic: Grossly intact, no focal deficits, moving all 4 extremities. Psychiatric: Normal mood and affect.   Assessment & Plan:    - Nephrolithiasis Stable nephrolithiasis.  KUB ordered and he will be notified with results.  Potassium citrate refilled.  We also discussed the availability of LithoLyte and he was provided literature.  Continue annual follow-up with KUB.   Abbie Sons, [place] [address] [place], [place] [address] [phone]

## 2019-11-13 ENCOUNTER — Telehealth: Payer: Self-pay | Admitting: *Deleted

## 2019-11-13 NOTE — Telephone Encounter (Signed)
Notified patient as instructed. Eft message on phone per [facility]

## 2019-11-13 NOTE — Telephone Encounter (Signed)
-----   Message from Abbie Sons, MD sent at 11/13/2019  7:56 AM EDT ----- KUB reviewed and no definite urinary tract calcifications noted

## 2020-11-11 ENCOUNTER — Ambulatory Visit: Payer: Self-pay | Admitting: Urology

## 2020-11-19 ENCOUNTER — Other Ambulatory Visit: Payer: Self-pay | Admitting: *Deleted

## 2020-11-19 DIAGNOSIS — N200 Calculus of kidney: Secondary | ICD-10-CM

## 2020-11-20 ENCOUNTER — Ambulatory Visit
Admission: RE | Admit: 2020-11-20 | Discharge: 2020-11-20 | Disposition: A | Discharge status: Home / Self Care | Payer: Medicare Other | Source: Ambulatory Visit | Attending: Urology | Admitting: Urology

## 2020-11-20 ENCOUNTER — Encounter: Payer: Self-pay | Admitting: Urology

## 2020-11-20 ENCOUNTER — Other Ambulatory Visit: Payer: Self-pay

## 2020-11-20 ENCOUNTER — Ambulatory Visit (INDEPENDENT_AMBULATORY_CARE_PROVIDER_SITE_OTHER): Payer: Medicare Other | Admitting: Urology

## 2020-11-20 VITALS — BP 154/77 | HR 66 | Ht 69.0 in | Wt 179.7 lb

## 2020-11-20 DIAGNOSIS — N200 Calculus of kidney: Secondary | ICD-10-CM

## 2020-11-20 DIAGNOSIS — N481 Balanitis: Secondary | ICD-10-CM

## 2020-11-20 MED ORDER — CLOTRIMAZOLE-BETAMETHASONE 1-0.05 % EX CREA: TOPICAL_CREAM | CUTANEOUS | 0 refills | Status: DC

## 2020-11-20 MED ORDER — POTASSIUM CITRATE ER 10 MEQ (1080 MG) PO TBCR: 10.0000 meq | EXTENDED_RELEASE_TABLET | Freq: Two times a day (BID) | ORAL | 6 refills | Status: DC

## 2020-11-20 NOTE — Progress Notes (Signed)
11/20/2020 9:31 AM   Barry Pace [birth year] [id number]  Referring provider: Idelle Crouch, MD [facility] [place],  [place] [address]  Chief Complaint  Patient presents with  . Nephrolithiasis    Urologic history: 1.  Nephrolithiasis -Prior shockwave lithotripsy, last 2019 -CT 2019 bilateral renal calculi -Potassium citrate  HPI: 85 y.o. male presents for annual follow-up.   No problems since last years visit  No flank, abdominal or pelvic pain  Mild lower urinary tract symptoms which are stable and not bothersome  Denies dysuria, gross hematuria  Stable urinary frequency which he states has been lifelong and not bothersome  He has noted intermittent irritation of his foreskin with redness and itching on occasions   PMH: Past Medical History:  Diagnosis Date  . Anemia   . Arthritis   . Cancer (HCC)    Pre-cancerous skin cells  . GERD (gastroesophageal reflux disease)   . History of BPH   . History of kidney stones   . Hyperlipidemia   . PUD (peptic ulcer disease)     Surgical History: Past Surgical History:  Procedure Laterality Date  . APPENDECTOMY    . EXTRACORPOREAL SHOCK WAVE LITHOTRIPSY Left 01/06/2018   Procedure: EXTRACORPOREAL SHOCK WAVE LITHOTRIPSY (ESWL);  Surgeon: Hollice Espy, MD;  Location: [facility] ORS;  Service: Urology;  Laterality: Left;  . EYE SURGERY Bilateral    Cataract Extraction with IOL  . HERNIA REPAIR Right 1999   Inguinal Hernia Repair  . INGUINAL HERNIA REPAIR Left 06/19/2016   Procedure: HERNIA REPAIR INGUINAL ADULT;  Surgeon: Leonie Green, MD;  Location: [facility] ORS;  Service: General;  Laterality: Left;    Home Medications:  Allergies as of 11/20/2020      Reactions   Atorvastatin    Other reaction(s): Muscle Pain   Escitalopram    Other reaction(s): Other (See Comments) Sedation   Ezetimibe-simvastatin Diarrhea   Other reaction(s): Muscle Pain Vytorin 10-20   Penicillins  Hives   Rosuvastatin    Other reaction(s): Muscle Pain      Medication List       Accurate as of November 20, 2020  9:31 AM. If you have any questions, ask your nurse or doctor.        STOP taking these medications   calcium carbonate 1250 (500 Ca) MG chewable tablet Commonly known as: OS-CAL Stopped by: Abbie Sons, MD   fluticasone 50 MCG/ACT nasal spray Commonly known as: FLONASE Stopped by: Abbie Sons, MD   folic acid 297 MCG tablet Commonly known as: FOLVITE Stopped by: Abbie Sons, MD   loratadine 10 MG tablet Commonly known as: CLARITIN Stopped by: Abbie Sons, MD   meloxicam 7.5 MG tablet Commonly known as: MOBIC Stopped by: Abbie Sons, MD     TAKE these medications   amLODipine 5 MG tablet Commonly known as: NORVASC Take 5 mg by mouth at bedtime.   bisoprolol 5 MG tablet Commonly known as: ZEBETA Take 5 mg by mouth daily.   calcium carbonate 500 MG chewable tablet Commonly known as: TUMS - dosed in mg elemental calcium Chew 1-2 tablets by mouth daily as needed for indigestion or heartburn.   carboxymethylcellulose 1 % ophthalmic solution Apply 1 drop to eye 3 (three) times daily as needed (for dry/itchy eyes). Thera Tears.   clotrimazole-betamethasone cream Commonly known as: LOTRISONE Apply to foreskin daily,  Times 2-3 days prn irration. Started by: Abbie Sons, MD  desonide 0.05 % cream Commonly known as: DESOWEN Apply 1 application topically 2 (two) times daily as needed (for dry/scaly areas in scalp).   ezetimibe 10 MG tablet Commonly known as: ZETIA Take 10 mg by mouth daily.   L-Lysine 500 MG Caps Take 500 mg by mouth daily as needed (for fever blisters/cold sores.).   levothyroxine 50 MCG tablet Commonly known as: SYNTHROID Take 50 mcg by mouth daily.   omeprazole 20 MG capsule Commonly known as: PRILOSEC Take 20 mg by mouth daily.   potassium citrate 10 MEQ (1080 MG) SR tablet Commonly known as:  UROCIT-K Take 1 tablet (10 mEq total) by mouth 2 (two) times daily.   Voltaren 1 % Gel Generic drug: diclofenac Sodium Apply 1 application topically 4 (four) times daily as needed. For pain/inflammation       Allergies:  Allergies  Allergen Reactions  . Atorvastatin     Other reaction(s): Muscle Pain  . Escitalopram     Other reaction(s): Other (See Comments) Sedation  . Ezetimibe-Simvastatin Diarrhea    Other reaction(s): Muscle Pain Vytorin 10-20  . Penicillins Hives  . Rosuvastatin     Other reaction(s): Muscle Pain    Family History: History reviewed. No pertinent family history.  Social History:  reports that he has never smoked. He has never used smokeless tobacco. He reports current alcohol use. He reports that he does not use drugs.   Physical Exam: BP (!) 154/77   Pulse 66   Ht 5\' 9"  (1.753 m)   Wt 179 lb 11.2 oz (81.5 kg)   BMI 26.54 kg/m   Constitutional:  Alert, No acute distress. HEENT: [place] AT, moist mucus membranes.  Trachea midline, no masses. Cardiovascular: No clubbing, cyanosis, or edema. Respiratory: Normal respiratory effort, no increased work of breathing. GI: Abdomen is soft, nontender, nondistended, no abdominal masses GU: Phallus uncircumcised, prepuce easily retracts.  No erythema, ulcers or lesions Skin: No rashes, bruises or suspicious lesions. Neurologic: Grossly intact, no focal deficits, moving all 4 extremities. Psychiatric: Normal mood and affect.   Pertinent Imaging: KUB performed today was reviewed and there are calcifications overlying the superior portion of the left renal outline and inferior portion of the right renal outline measuring ~ 3 mm  Assessment & Plan:    1.  Nephrolithiasis  Stable  Potassiums citrate refilled  2.  Balanitis  New problem  Recurrent irritation prepuce  Trial Lotrisone cream prn  Continue annual follow-up   Abbie Sons, MD  [facility] [address], [place], [place] [address] [phone]

## 2021-04-07 ENCOUNTER — Other Ambulatory Visit: Payer: Self-pay | Admitting: Urology

## 2021-09-30 ENCOUNTER — Emergency Department
Admission: EM | Admit: 2021-09-30 | Discharge: 2021-09-30 | Disposition: A | Discharge status: Home / Self Care | Payer: Medicare Other | Attending: Emergency Medicine | Admitting: Emergency Medicine

## 2021-09-30 ENCOUNTER — Encounter: Payer: Self-pay | Admitting: Intensive Care

## 2021-09-30 ENCOUNTER — Other Ambulatory Visit: Payer: Self-pay

## 2021-09-30 ENCOUNTER — Emergency Department: Payer: Medicare Other

## 2021-09-30 DIAGNOSIS — I10 Essential (primary) hypertension: Secondary | ICD-10-CM | POA: Diagnosis not present

## 2021-09-30 DIAGNOSIS — M25562 Pain in left knee: Secondary | ICD-10-CM

## 2021-09-30 DIAGNOSIS — G20 Parkinson's disease: Secondary | ICD-10-CM | POA: Insufficient documentation

## 2021-09-30 DIAGNOSIS — W19XXXA Unspecified fall, initial encounter: Secondary | ICD-10-CM | POA: Diagnosis not present

## 2021-09-30 HISTORY — DX: Parkinson's disease: G20

## 2021-09-30 HISTORY — DX: Parkinson's disease without dyskinesia, without mention of fluctuations: G20.A1

## 2021-09-30 HISTORY — DX: Essential (primary) hypertension: I10

## 2021-09-30 NOTE — Discharge Instructions (Addendum)
-  Treat pain with Tylenol/ibuprofen as needed.  ?-Follow-up with the orthopedist listed above as discussed. ?-Rest and ice the affected knee for the first 24 to 48 hours. ?-Maintain the knee splint until you can be seen by orthopedics. ?

## 2021-09-30 NOTE — ED Triage Notes (Signed)
Arrived by EMS for left knee pain. Reports he was walking today and his left knee gave out. Has improved some. Denies injury and denies this happening before ?

## 2021-09-30 NOTE — ED Notes (Signed)
Dc instructions reviewed with pt and daughter no questions or concerns at this time.  ?

## 2021-09-30 NOTE — ED Provider Notes (Incomplete)
? ?[facility] ?Provider Note ? ? ? Event Date/Time  ? First MD Initiated Contact with Patient 09/30/21 1730   ?  (approximate) ? ? ?History  ? ?Chief Complaint ?Knee Pain ? ? ?HPI ?Barry Pace is a 86 y.o. male, ***  ? ?*** Denies fever/chills, chest pain, shortness of breath, abdominal pain, flank pain, nausea/vomiting, diarrhea, urinary symptoms, vision changes, hearing changes, rashes/lesions, numbness/tingling in upper or lower extremities, or dizziness/lightheadedness.  ? ?Per records review, *** ? ?History Limitations: *** ? ?    ? ? ?Physical Exam  ?Triage Vital Signs: ?ED Triage Vitals  ?Enc Vitals Group  ?   BP 09/30/21 1628 (!) 161/79  ?   Pulse Rate 09/30/21 1628 68  ?   Resp 09/30/21 1628 16  ?   Temp 09/30/21 1628 98.6 ?F (37 ?C)  ?   Temp Source 09/30/21 1628 Oral  ?   SpO2 09/30/21 1628 96 %  ?   Weight 09/30/21 1629 180 lb (81.6 kg)  ?   Height 09/30/21 1629 '5\' 9"'$  (1.753 m)  ?   Head Circumference --   ?   Peak Flow --   ?   Pain Score 09/30/21 1629 0  ?   Pain Loc --   ?   Pain Edu? --   ?   Excl. in [place]? --   ? ? ?Most recent vital signs: ?Vitals:  ? 09/30/21 1628  ?BP: (!) 161/79  ?Pulse: 68  ?Resp: 16  ?Temp: 98.6 ?F (37 ?C)  ?SpO2: 96%  ? ? ?General: Awake, NAD. *** ?Skin: Warm, dry. No rashes or lesions. *** ?Eyes: PERRL. Conjunctivae normal. *** ?Neck: Normal ROM. No nuchal rigidity. *** ?CV: Good peripheral perfusion. *** ?Resp: Normal effort. *** ?Abd: Soft, non-tender. No distention. *** ?Neuro: At baseline. No gross neurological deficits. *** ?MSK: No gross deformities. Normal ROM in all extremities. *** ? ?Other: *** ? ?Physical Exam ? ? ? ?ED Results / Procedures / Treatments  ?Labs ?(all labs ordered are listed, but only abnormal results are displayed) ?Labs Reviewed - No data to display ? ? ?EKG ?*** ? ? ?RADIOLOGY ? ?ED Provider Interpretation: *** ? ?DG Knee Complete 4 Views Left ? ?Result Date: 09/30/2021 ?CLINICAL DATA:  Left knee pain.  No known injury.  EXAM: LEFT KNEE - COMPLETE 4+ VIEW COMPARISON:  None. FINDINGS: No fracture or dislocation. 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 No joint effusion. No evidence of chondrocalcinosis. Scattered adjacent vascular calcifications. No radiopaque foreign body. IMPRESSION: 1. No acute findings. 2. 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 Electronically Signed   By: Sandi Mariscal M.D.   On: 09/30/2021 17:09   ? ?PROCEDURES: ? ?Critical Care performed: *** ? ?Procedures ? ? ? ?MEDICATIONS ORDERED IN ED: ?Medications - No data to display ? ? ?IMPRESSION / MDM / ASSESSMENT AND PLAN / ED COURSE  ?I reviewed the triage vital signs and the nursing notes. ?             ?               ? ?Differential diagnosis includes, but is not limited to, *** ? ?ED Course ?*** ? ?Assessment/Plan ?*** ? ?Considered admission for this patient, but *** ? ?***Provided the patient with anticipatory guidance, return precautions, and educational material. Encouraged the patient to return to the emergency department at any time if they begin to experience any new or  worsening symptoms. Patient expressed understanding and agreed with the plan.  ? ?  ? ? ?FINAL CLINICAL IMPRESSION(S) / ED DIAGNOSES  ? ?Final diagnoses:  ?None  ? ? ? ?Rx / DC Orders  ? ?ED Discharge Orders   ? ? None  ? ?  ? ? ? ?Note:  This document was prepared using Dragon voice recognition software and may include unintentional dictation errors. ?

## 2021-09-30 NOTE — ED Provider Notes (Signed)
? ?[facility] ?Provider Note ? ? ? Event Date/Time  ? First MD Initiated Contact with Patient 09/30/21 1730   ?  (approximate) ? ? ?History  ? ?Chief Complaint ?Knee Pain ? ? ?HPI ?Barry Pace is a 86 y.o. male, history of nephrolithiasis, PUD, Parkinson's hyperlipidemia, hypertension, presents to the emergency department for evaluation of knee pain.  Patient states that he was standing for about 15 minutes when he reportedly turned too fast, causing his left knee to give out.  Following the fall, patient felt uncomfortable trying to get up, so he called EMS.  Denied head injury or LOC.  He states he is not currently experiencing any pain in his knee at this time, but does state that his leg feels unsteady.  Denies fever/chills, chest pain, shortness of breath, abdominal pain, flank pain, nausea/vomiting, diarrhea, urinary symptoms, or dizziness/lightheadedness. ? ?History Limitations: No limitations. ? ?    ? ? ?Physical Exam  ?Triage Vital Signs: ?ED Triage Vitals  ?Enc Vitals Group  ?   BP 09/30/21 1628 (!) 161/79  ?   Pulse Rate 09/30/21 1628 68  ?   Resp 09/30/21 1628 16  ?   Temp 09/30/21 1628 98.6 ?F (37 ?C)  ?   Temp Source 09/30/21 1628 Oral  ?   SpO2 09/30/21 1628 96 %  ?   Weight 09/30/21 1629 180 lb (81.6 kg)  ?   Height 09/30/21 1629 '5\' 9"'$  (1.753 m)  ?   Head Circumference --   ?   Peak Flow --   ?   Pain Score 09/30/21 1629 0  ?   Pain Loc --   ?   Pain Edu? --   ?   Excl. in [place]? --   ? ? ?Most recent vital signs: ?Vitals:  ? 09/30/21 1628 09/30/21 2000  ?BP: (!) 161/79 (!) 158/75  ?Pulse: 68 70  ?Resp: 16 16  ?Temp: 98.6 ?F (37 ?C) 98.7 ?F (37.1 ?C)  ?SpO2: 96% 97%  ? ? ?General: Awake, NAD.  ?Skin: Warm, dry. No rashes or lesions.  ?Eyes: PERRL. Conjunctivae normal.  ?Neck: Normal ROM. No nuchal rigidity.  ?CV: Good peripheral perfusion.  ?Resp: Normal effort.  ?Abd: Soft, non-tender. No distention.  ?Neuro: At baseline. No gross neurological deficits.  5/5 strength in  upper and lower extremities.  Cranial nerves II through XII intact.  Patient maintains normal sensation in all extremities. ?MSK: No gross deformities. Normal ROM in all extremities.  Patient is able to flex and extend the left knee with full range of motion.  Negative anterior drawer.  No pain elicited with valgus/varus maneuver.  No bony tenderness.  Pulse, motor, sensation intact distally. ? ?Other: Patient is able to ambulate with assistance, though does state that he feels like his left foot drags slightly on the floor and he feels unsteady in his left lower extremity. ? ?Physical Exam ? ? ? ?ED Results / Procedures / Treatments  ?Labs ?(all labs ordered are listed, but only abnormal results are displayed) ?Labs Reviewed - No data to display ? ? ?EKG ?Not applicable. ? ? ?RADIOLOGY ? ?ED Provider Interpretation: I personally reviewed this x-ray no evidence of acute findings based on my interpretation. ? ?DG Knee Complete 4 Views Left ? ?Result Date: 09/30/2021 ?CLINICAL DATA:  Left knee pain.  No known injury. EXAM: LEFT KNEE - COMPLETE 4+ VIEW COMPARISON:  None. FINDINGS: No fracture or dislocation. 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 No joint effusion. No evidence of chondrocalcinosis. Scattered adjacent vascular calcifications. No radiopaque foreign body. IMPRESSION: 1. No acute findings. 2. 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 Electronically Signed   By: Sandi Mariscal M.D.   On: 09/30/2021 17:09   ? ?PROCEDURES: ? ?Critical Care performed: None. ? ?Procedures ? ? ? ?MEDICATIONS ORDERED IN ED: ?Medications - No data to display ? ? ?IMPRESSION / MDM / ASSESSMENT AND PLAN / ED COURSE  ?I reviewed the triage vital signs and the nursing notes. ?             ?               ? ?Differential diagnosis includes, but is not limited to, ACL/PCL injury, MCL/LCL injury, knee sprain, CVA/TIA, peroneal nerve injury ? ?ED  Course ?Patient appears well.  Vitals within normal limits.  NAD. ? ?Knee x-ray shows no acute findings. ? ?Assessment/Plan ?Patient presents with knee pain and difficulty ambulating following recent fall.  Physical exam and x-ray reassuring for no serious pathology in the left knee, though patient does have a noticeable drag in his left foot when trying to walk.  He is still able to ambulate on his own, however per daughter, he was walking much better earlier today.  Concern for possible peroneal nerve injury versus CVA/TIA.  Dr.Kinner spoke with the patient, recommending MRI imaging to evaluate for these pathologies, however patient refused and said that he wished to go home.  Provided patient with a knee brace for additional stability.  We will plan to discharge. ? ?Provided the patient with anticipatory guidance, return precautions, and educational material. Encouraged the patient to return to the emergency department at any time if they begin to experience any new or worsening symptoms. Patient expressed understanding and agreed with the plan.  ? ?  ? ? ?FINAL CLINICAL IMPRESSION(S) / ED DIAGNOSES  ? ?Final diagnoses:  ?Acute pain of left knee  ? ? ? ?Rx / DC Orders  ? ?ED Discharge Orders   ? ? None  ? ?  ? ? ? ?Note:  This document was prepared using Dragon voice recognition software and may include unintentional dictation errors. ?  ?Teodoro Spray, [place] ?10/01/21 1012 ? ?  ?Lavonia Drafts, MD ?10/06/21 [phone] ? ?

## 2021-09-30 NOTE — ED Triage Notes (Signed)
First nurse note: Pt comes ems from home with left knee pain after standing for about 15 mins and turned too fast and left knee gave out ?

## 2021-10-01 ENCOUNTER — Ambulatory Visit: Payer: Self-pay

## 2021-10-01 NOTE — Telephone Encounter (Signed)
? ? ?  Chief Complaint: Seen in ED yesterday with left leg numbness/pain ?Symptoms: Can not use left leg ?Frequency: Yesterday ?Pertinent Negatives: Patient denies any other areas of numbness ?Disposition: '[]'$ ED /'[]'$ Urgent Care (no appt availability in office) / '[]'$ Appointment(In office/virtual)/ '[]'$  [facility]/ '[]'$ Home Care/ '[]'$ Refused Recommended Disposition /'[]'$ [facility]/ '[]'$  Follow-up with PCP ?Additional Notes: Instructed to go to [facility] per instructions at ED. Wife upset and says [facility] does not have an order from ED.States "I'm not sure what I'm going to do."  ?Reason for Disposition ? Patient sounds very sick or weak to the triager ? ?Answer Assessment - Initial Assessment Questions ?1. SYMPTOM: "What is the main symptom you are concerned about?" (e.g., weakness, numbness) ?    Numbness left leg ?2. ONSET: "When did this start?" (minutes, hours, days; while sleeping) ?    Yesterday ?3. LAST NORMAL: "When was the last time you (the patient) were normal (no symptoms)?" ?    Yesterday ?4. PATTERN "Does this come and go, or has it been constant since it started?"  "Is it present now?" ?    Constant ?5. CARDIAC SYMPTOMS: "Have you had any of the following symptoms: chest pain, difficulty breathing, palpitations?" ?    No ?6. NEUROLOGIC SYMPTOMS: "Have you had any of the following symptoms: headache, dizziness, vision loss, double vision, changes in speech, unsteady on your feet?" ?    No ?7. OTHER SYMPTOMS: "Do you have any other symptoms?" ?    Can't move leg ?8. PREGNANCY: "Is there any chance you are pregnant?" "When was your last menstrual period?" ?    N/a ? ?Protocols used: Neurologic Deficit-A-AH ? ?

## 2021-10-02 ENCOUNTER — Other Ambulatory Visit: Payer: Self-pay | Admitting: Physician Assistant

## 2021-10-02 ENCOUNTER — Other Ambulatory Visit (HOSPITAL_COMMUNITY): Payer: Self-pay | Admitting: Physician Assistant

## 2021-10-02 DIAGNOSIS — R29898 Other symptoms and signs involving the musculoskeletal system: Secondary | ICD-10-CM

## 2021-10-02 DIAGNOSIS — Z8673 Personal history of transient ischemic attack (TIA), and cerebral infarction without residual deficits: Secondary | ICD-10-CM

## 2021-10-09 ENCOUNTER — Ambulatory Visit
Admission: RE | Admit: 2021-10-09 | Discharge: 2021-10-09 | Disposition: A | Discharge status: Home / Self Care | Payer: Medicare Other | Source: Ambulatory Visit | Attending: Physician Assistant | Admitting: Physician Assistant

## 2021-10-09 DIAGNOSIS — Z8673 Personal history of transient ischemic attack (TIA), and cerebral infarction without residual deficits: Secondary | ICD-10-CM | POA: Diagnosis present

## 2021-10-09 DIAGNOSIS — R29898 Other symptoms and signs involving the musculoskeletal system: Secondary | ICD-10-CM | POA: Insufficient documentation

## 2021-11-19 ENCOUNTER — Other Ambulatory Visit: Payer: Self-pay | Admitting: Family Medicine

## 2021-11-19 DIAGNOSIS — N200 Calculus of kidney: Secondary | ICD-10-CM

## 2021-11-21 ENCOUNTER — Ambulatory Visit
Admission: RE | Admit: 2021-11-21 | Discharge: 2021-11-21 | Disposition: A | Discharge status: Home / Self Care | Payer: Medicare Other | Attending: Urology | Admitting: Urology

## 2021-11-21 ENCOUNTER — Encounter: Payer: Self-pay | Admitting: Urology

## 2021-11-21 ENCOUNTER — Ambulatory Visit
Admission: RE | Admit: 2021-11-21 | Discharge: 2021-11-21 | Disposition: A | Discharge status: Home / Self Care | Payer: Medicare Other | Source: Ambulatory Visit | Attending: Urology | Admitting: Urology

## 2021-11-21 ENCOUNTER — Ambulatory Visit (INDEPENDENT_AMBULATORY_CARE_PROVIDER_SITE_OTHER): Payer: Medicare Other | Admitting: Urology

## 2021-11-21 VITALS — BP 133/68 | HR 63 | Ht 69.0 in | Wt 181.0 lb

## 2021-11-21 DIAGNOSIS — N200 Calculus of kidney: Secondary | ICD-10-CM

## 2021-11-21 DIAGNOSIS — N481 Balanitis: Secondary | ICD-10-CM

## 2021-11-21 MED ORDER — CLOTRIMAZOLE-BETAMETHASONE 1-0.05 % EX CREA: TOPICAL_CREAM | CUTANEOUS | 0 refills | Status: DC

## 2021-11-21 NOTE — Progress Notes (Signed)
11/21/2021 9:51 AM   Barry Pace Apr 07, 1936 [id number]  Referring provider: Idelle Crouch, MD [facility] [place],  [place] [address]  Chief Complaint  Patient presents with   Nephrolithiasis    Urologic history: 1.  Nephrolithiasis -Prior shockwave lithotripsy, last 2019 -CT 2019 bilateral renal calculi -Potassium citrate  HPI: 86 y.o. male presents for annual follow-up.  Doing well since last visit No bothersome LUTS Denies dysuria, gross hematuria Denies flank, abdominal or pelvic pain At last years visit prescribed Lotrisone cream for balanitis.  He has occasional flares and states this works well and requested a refill   PMH: Past Medical History:  Diagnosis Date   Anemia    Arthritis    Cancer ([place])    Pre-cancerous skin cells   GERD (gastroesophageal reflux disease)    History of BPH    History of kidney stones    Hyperlipidemia    Hypertension    Parkinson's disease ([place])    PUD (peptic ulcer disease)     Surgical History: Past Surgical History:  Procedure Laterality Date   APPENDECTOMY     EXTRACORPOREAL SHOCK WAVE LITHOTRIPSY Left 01/06/2018   Procedure: EXTRACORPOREAL SHOCK WAVE LITHOTRIPSY (ESWL);  Surgeon: Hollice Espy, MD;  Location: [facility] ORS;  Service: Urology;  Laterality: Left;   EYE SURGERY Bilateral    Cataract Extraction with IOL   HERNIA REPAIR Right 1999   Inguinal Hernia Repair   INGUINAL HERNIA REPAIR Left 06/19/2016   Procedure: HERNIA REPAIR INGUINAL ADULT;  Surgeon: Leonie Green, MD;  Location: [facility] ORS;  Service: General;  Laterality: Left;    Home Medications:  Allergies as of 11/21/2021       Reactions   Atorvastatin    Other reaction(s): Muscle Pain   Escitalopram    Other reaction(s): Other (See Comments) Sedation   Ezetimibe-simvastatin Diarrhea   Other reaction(s): Muscle Pain Vytorin 10-20   Penicillins Hives   Rosuvastatin    Other reaction(s): Muscle Pain         Medication List        Accurate as of November 21, 2021  9:51 AM. If you have any questions, ask your nurse or doctor.          amLODipine 5 MG tablet Commonly known as: NORVASC Take 5 mg by mouth at bedtime.   bisoprolol 5 MG tablet Commonly known as: ZEBETA Take 5 mg by mouth daily.   calcium carbonate 500 MG chewable tablet Commonly known as: TUMS - dosed in mg elemental calcium Chew 1-2 tablets by mouth daily as needed for indigestion or heartburn.   carboxymethylcellulose 1 % ophthalmic solution Apply 1 drop to eye 3 (three) times daily as needed (for dry/itchy eyes). Thera Tears.   clotrimazole-betamethasone cream Commonly known as: LOTRISONE APPLY TO FORESKIN TWO TO THREE TIMES DAILY AS NEEDED FOR IRRITATION   desonide 0.05 % cream Commonly known as: DESOWEN Apply 1 application topically 2 (two) times daily as needed (for dry/scaly areas in scalp).   ezetimibe 10 MG tablet Commonly known as: ZETIA Take 10 mg by mouth daily.   L-Lysine 500 MG Caps Take 500 mg by mouth daily as needed (for fever blisters/cold sores.).   levothyroxine 50 MCG tablet Commonly known as: SYNTHROID Take 50 mcg by mouth daily.   omeprazole 20 MG capsule Commonly known as: PRILOSEC Take 20 mg by mouth daily.   potassium citrate 10 MEQ (1080 MG) SR tablet Commonly known as: UROCIT-K Take 1  tablet (10 mEq total) by mouth 2 (two) times daily.   Voltaren 1 % Gel Generic drug: diclofenac Sodium Apply 1 application topically 4 (four) times daily as needed. For pain/inflammation        Allergies:  Allergies  Allergen Reactions   Atorvastatin     Other reaction(s): Muscle Pain   Escitalopram     Other reaction(s): Other (See Comments) Sedation   Ezetimibe-Simvastatin Diarrhea    Other reaction(s): Muscle Pain Vytorin 10-20   Penicillins Hives   Rosuvastatin     Other reaction(s): Muscle Pain    Family History: No family history on file.  Social History:  reports  that he has quit smoking. His smoking use included pipe. He has never used smokeless tobacco. He reports current alcohol use. He reports that he does not use drugs.   Physical Exam: BP 133/68   Pulse 63   Ht '5\' 9"'$  (1.753 m)   Wt 181 lb (82.1 kg)   BMI 26.73 kg/m   Constitutional:  Alert, No acute distress. HEENT: [place] AT, moist mucus membranes.  Trachea midline, no masses. Cardiovascular: No clubbing, cyanosis, or edema. Respiratory: Normal respiratory effort, no increased work of breathing. GI: Abdomen is soft, nontender, nondistended, no abdominal masses GU: Phallus uncircumcised, prepuce easily retracts.  No erythema, ulcers or lesions Skin: No rashes, bruises or suspicious lesions. Neurologic: Grossly intact, no focal deficits, moving all 4 extremities. Psychiatric: Normal mood and affect.   Pertinent Imaging: KUB performed today was reviewed and there are small, stable bilateral calcifications overlying the renal outline  Assessment & Plan:    1.  Nephrolithiasis Stable Continue potassium citrate  2.  Balanitis-recurrent Lotrisone refilled  Continue annual follow-up   Abbie Sons, MD  [facility] [address] [place], [place] [address] [phone]

## 2021-11-26 ENCOUNTER — Other Ambulatory Visit: Payer: Self-pay | Admitting: Urology

## 2021-11-26 DIAGNOSIS — N200 Calculus of kidney: Secondary | ICD-10-CM

## 2022-05-25 ENCOUNTER — Other Ambulatory Visit: Payer: Self-pay | Admitting: Urology

## 2022-05-25 DIAGNOSIS — N200 Calculus of kidney: Secondary | ICD-10-CM

## 2022-11-20 ENCOUNTER — Ambulatory Visit (INDEPENDENT_AMBULATORY_CARE_PROVIDER_SITE_OTHER): Payer: Medicare Other | Admitting: Urology

## 2022-11-20 ENCOUNTER — Ambulatory Visit
Admission: RE | Admit: 2022-11-20 | Discharge: 2022-11-20 | Disposition: A | Discharge status: Home / Self Care | Payer: Medicare Other | Source: Ambulatory Visit | Attending: Urology | Admitting: Urology

## 2022-11-20 ENCOUNTER — Encounter: Payer: Self-pay | Admitting: Urology

## 2022-11-20 ENCOUNTER — Ambulatory Visit
Admission: RE | Admit: 2022-11-20 | Discharge: 2022-11-20 | Disposition: A | Discharge status: Home / Self Care | Payer: Medicare Other | Attending: Urology | Admitting: Urology

## 2022-11-20 VITALS — BP 146/79 | HR 60 | Ht 69.0 in | Wt 145.0 lb

## 2022-11-20 DIAGNOSIS — N200 Calculus of kidney: Secondary | ICD-10-CM | POA: Diagnosis not present

## 2022-11-20 DIAGNOSIS — N481 Balanitis: Secondary | ICD-10-CM | POA: Diagnosis not present

## 2022-11-20 MED ORDER — POTASSIUM CITRATE ER 10 MEQ (1080 MG) PO TBCR: 10.0000 meq | EXTENDED_RELEASE_TABLET | Freq: Two times a day (BID) | ORAL | 2 refills | Status: DC

## 2022-11-20 MED ORDER — CLOTRIMAZOLE-BETAMETHASONE 1-0.05 % EX CREA: TOPICAL_CREAM | CUTANEOUS | 0 refills | Status: DC

## 2022-11-20 NOTE — Progress Notes (Signed)
I, Barry Pace,acting as a scribe for Barry Altes, MD.,have documented all relevant documentation on the behalf of Barry Altes, MD,as directed by  Barry Altes, MD while in the presence of Barry Altes, MD.  11/20/2022 11:15 AM   [name] Nov 09, 1935 [id number]  Referring provider: Marguarite Arbour, MD [address] [facility] [place],  [place] [address]  Chief Complaint  Patient presents with   Nephrolithiasis   Urologic history: 1.  Nephrolithiasis Prior shockwave lithotripsy, last 2019 CT 2019 bilateral renal calculi Potassium citrate  HPI: Barry Pace is a 87 y.o. male annual follow-up for nephrolithiasis  Doing well since last visit No bothersome LUTS Denies dysuria, gross hematuria Denies flank, abdominal or pelvic pain    PMH: Past Medical History:  Diagnosis Date   Anemia    Arthritis    Cancer ([facility])    Pre-cancerous skin cells   GERD (gastroesophageal reflux disease)    History of BPH    History of kidney stones    Hyperlipidemia    Hypertension    Parkinson's disease    PUD (peptic ulcer disease)     Surgical History: Past Surgical History:  Procedure Laterality Date   APPENDECTOMY     EXTRACORPOREAL SHOCK WAVE LITHOTRIPSY Left 01/06/2018   Procedure: EXTRACORPOREAL SHOCK WAVE LITHOTRIPSY (ESWL);  Surgeon: Vanna Scotland, MD;  Location: [facility];  Service: Urology;  Laterality: Left;   EYE SURGERY Bilateral    Cataract Extraction with IOL   HERNIA REPAIR Right 1999   Inguinal Hernia Repair   INGUINAL HERNIA REPAIR Left 06/19/2016   Procedure: HERNIA REPAIR INGUINAL ADULT;  Surgeon: Nadeen Landau, MD;  Location: [facility];  Service: General;  Laterality: Left;    Home Medications:  Allergies as of 11/20/2022       Reactions   Atorvastatin    Other reaction(s): Muscle Pain   Escitalopram    Other reaction(s): Other (See Comments) Sedation   Ezetimibe-simvastatin Diarrhea   Other reaction(s):  Muscle Pain Vytorin 10-20   Penicillins Hives   Rosuvastatin    Other reaction(s): Muscle Pain        Medication List        Accurate as of Nov 20, 2022 11:15 AM. If you have any questions, ask your nurse or doctor.          amLODipine 5 MG tablet Commonly known as: NORVASC Take 5 mg by mouth at bedtime.   bisoprolol 5 MG tablet Commonly known as: ZEBETA Take 5 mg by mouth daily.   calcium carbonate 500 MG chewable tablet Commonly known as: TUMS - dosed in mg elemental calcium Chew 1-2 tablets by mouth daily as needed for indigestion or heartburn.   carboxymethylcellulose 1 % ophthalmic solution Apply 1 drop to eye 3 (three) times daily as needed (for dry/itchy eyes). Thera Tears.   clotrimazole-betamethasone cream Commonly known as: LOTRISONE APPLY TO FORESKIN TWO TO THREE TIMES DAILY AS NEEDED FOR IRRITATION   desonide 0.05 % cream Commonly known as: DESOWEN Apply 1 application topically 2 (two) times daily as needed (for dry/scaly areas in scalp).   ezetimibe 10 MG tablet Commonly known as: ZETIA Take 10 mg by mouth daily.   L-Lysine 500 MG Caps Take 500 mg by mouth daily as needed (for fever blisters/cold sores.).   levothyroxine 50 MCG tablet Commonly known as: SYNTHROID Take 50 mcg by mouth daily.   omeprazole 20 MG capsule Commonly known as: PRILOSEC Take  20 mg by mouth daily.   potassium citrate 10 MEQ (1080 MG) SR tablet Commonly known as: UROCIT-K Take 1 tablet (10 mEq total) by mouth 2 (two) times daily.   Voltaren 1 % Gel Generic drug: diclofenac Sodium Apply 1 application topically 4 (four) times daily as needed. For pain/inflammation        Allergies:  Allergies  Allergen Reactions   Atorvastatin     Other reaction(s): Muscle Pain   Escitalopram     Other reaction(s): Other (See Comments) Sedation   Ezetimibe-Simvastatin Diarrhea    Other reaction(s): Muscle Pain Vytorin 10-20   Penicillins Hives   Rosuvastatin     Other  reaction(s): Muscle Pain    Social History:  reports that he has quit smoking. His smoking use included pipe. He has never used smokeless tobacco. He reports current alcohol use. He reports that he does not use drugs.   Physical Exam: BP (!) 146/79   Pulse 60   Ht 5\' 9"  (1.753 m)   Wt 145 lb (65.8 kg)   BMI 21.41 kg/m   Constitutional:  Alert and oriented, No acute distress. HEENT: [place] AT Respiratory: Normal respiratory effort, no increased work of breathing. Psychiatric: Normal mood and affect.   Pertinent Imaging: KUB performed today was personally reviewed and interpreted. There are small stable bilateral calcifications overlying the renal outlines, and no significant change from previous KUB June 2023.   Assessment & Plan:    1. Bilateral nephrolithiasis Stable Potassium citrate refilled 1 year follow-up with KUB.  2. Balanitis Lotrisone refilled for occasional flares of balanitis.  I have reviewed the above documentation for accuracy and completeness, and I agree with the above.    Barry Altes, MD   [facility] [address] [place], [place] [address] [phone]

## 2023-05-25 ENCOUNTER — Other Ambulatory Visit: Payer: Self-pay | Admitting: Urology

## 2023-05-25 DIAGNOSIS — N200 Calculus of kidney: Secondary | ICD-10-CM

## 2023-07-29 ENCOUNTER — Other Ambulatory Visit: Payer: Self-pay | Admitting: Urology

## 2023-11-19 ENCOUNTER — Ambulatory Visit: Payer: Self-pay | Admitting: Urology

## 2023-12-13 ENCOUNTER — Ambulatory Visit
Admission: RE | Admit: 2023-12-13 | Discharge: 2023-12-13 | Disposition: A | Discharge status: Home / Self Care | Source: Ambulatory Visit | Attending: Physician Assistant

## 2023-12-13 ENCOUNTER — Ambulatory Visit
Admission: RE | Admit: 2023-12-13 | Discharge: 2023-12-13 | Disposition: A | Discharge status: Home / Self Care | Attending: Physician Assistant | Admitting: Physician Assistant

## 2023-12-13 ENCOUNTER — Other Ambulatory Visit: Payer: Self-pay | Admitting: Physician Assistant

## 2023-12-13 ENCOUNTER — Encounter: Payer: Self-pay | Admitting: Urology

## 2023-12-13 ENCOUNTER — Ambulatory Visit (INDEPENDENT_AMBULATORY_CARE_PROVIDER_SITE_OTHER): Admitting: Urology

## 2023-12-13 ENCOUNTER — Other Ambulatory Visit: Payer: Self-pay | Admitting: Urology

## 2023-12-13 VITALS — BP 129/71 | HR 85 | Ht 69.0 in | Wt 170.0 lb

## 2023-12-13 DIAGNOSIS — N200 Calculus of kidney: Secondary | ICD-10-CM | POA: Diagnosis not present

## 2023-12-13 NOTE — Progress Notes (Unsigned)
 12/13/2023 2:21 PM   Barry Pace 05/22/1936 [id number]  Referring provider: Auston Reyes BIRCH, MD [address] [facility] [place],  [place] [address]  Chief Complaint  Patient presents with   Nephrolithiasis   Urologic history: 1.  Nephrolithiasis Prior shockwave lithotripsy, last 2019 CT 2019 bilateral renal calculi Potassium citrate   HPI: Barry Pace is a 88 y.o. male annual follow-up for nephrolithiasis  Doing well since last visit No bothersome LUTS Denies dysuria, gross hematuria Denies flank, abdominal or pelvic pain  Remains on potassium citrate    PMH: Past Medical History:  Diagnosis Date   Anemia    Arthritis    Cancer ([facility])    Pre-cancerous skin cells   GERD (gastroesophageal reflux disease)    History of BPH    History of kidney stones    Hyperlipidemia    Hypertension    Parkinson's disease    PUD (peptic ulcer disease)     Surgical History: Past Surgical History:  Procedure Laterality Date   APPENDECTOMY     EXTRACORPOREAL SHOCK WAVE LITHOTRIPSY Left 01/06/2018   Procedure: EXTRACORPOREAL SHOCK WAVE LITHOTRIPSY (ESWL);  Surgeon: Penne Knee, MD;  Location: [facility] OR[facility];  Service: Urology;  Laterality: Left;   EYE SURGERY Bilateral    Cataract Extraction with IOL   HERNIA REPAIR Right 1999   Inguinal Hernia Repair   INGUINAL HERNIA REPAIR Left 06/19/2016   Procedure: HERNIA REPAIR INGUINAL ADULT;  Surgeon: Larinda Unknown Sharps, MD;  Location: [facility] ORS;  Service: General;  Laterality: Left;    Home Medications:  Allergies as of 12/13/2023       Reactions   Atorvastatin    Other reaction(s): Muscle Pain   Escitalopram    Other reaction(s): Other (See Comments) Sedation   Ezetimibe-simvastatin Diarrhea   Other reaction(s): Muscle Pain Vytorin 10-20   Penicillins Hives   Rosuvastatin    Other reaction(s): Muscle Pain        Medication List        Accurate as of December 13, 2023  2:21 PM. If you have any  questions, ask your nurse or doctor.          amLODipine 5 MG tablet Commonly known as: NORVASC Take 5 mg by mouth at bedtime.   bisoprolol 5 MG tablet Commonly known as: ZEBETA Take 5 mg by mouth daily.   calcium carbonate 500 MG chewable tablet Commonly known as: TUMS - dosed in mg elemental calcium Chew 1-2 tablets by mouth daily as needed for indigestion or heartburn.   carboxymethylcellulose 1 % ophthalmic solution Apply 1 drop to eye 3 (three) times daily as needed (for dry/itchy eyes). Thera Tears.   clotrimazole -betamethasone  cream Commonly known as: LOTRISONE  APPLY TO FORESKIN TWO TO THREE TIMES DAILY AS NEEDED FOR IRRITATION   desonide 0.05 % cream Commonly known as: DESOWEN Apply 1 application topically 2 (two) times daily as needed (for dry/scaly areas in scalp).   ezetimibe 10 MG tablet Commonly known as: ZETIA Take 10 mg by mouth daily.   L-Lysine 500 MG Caps Take 500 mg by mouth daily as needed (for fever blisters/cold sores.).   levothyroxine 50 MCG tablet Commonly known as: SYNTHROID Take 50 mcg by mouth daily.   omeprazole 20 MG capsule Commonly known as: PRILOSEC Take 20 mg by mouth daily.   potassium citrate  10 MEQ (1080 MG) SR tablet Commonly known as: UROCIT-K  Take 1 tablet by mouth twice daily   Voltaren 1 % Gel Generic drug: diclofenac  Sodium Apply 1 application topically 4 (four) times daily as needed. For pain/inflammation        Allergies:  Allergies  Allergen Reactions   Atorvastatin     Other reaction(s): Muscle Pain   Escitalopram     Other reaction(s): Other (See Comments) Sedation   Ezetimibe-Simvastatin Diarrhea    Other reaction(s): Muscle Pain Vytorin 10-20   Penicillins Hives   Rosuvastatin     Other reaction(s): Muscle Pain    Social History:  reports that he has quit smoking. His smoking use included pipe. He has never used smokeless tobacco. He reports current alcohol use. He reports that he does not use  drugs.   Physical Exam: BP 129/71   Pulse 85   Ht 5' 9 (1.753 m)   Wt 170 lb (77.1 kg)   BMI 25.10 kg/m   Constitutional:  Alert and oriented, No acute distress. HEENT: [place] AT Respiratory: Normal respiratory effort, no increased work of breathing. Psychiatric: Normal mood and affect.   Pertinent Imaging: KUB performed today was personally reviewed and interpreted. There are small stable bilateral calcifications overlying the renal outlines, and no significant change from previous KUB May 2024   Assessment & Plan:    1. Bilateral nephrolithiasis Stable Potassium citrate  refilled 1 year follow-up with KUB.   Barry JAYSON Barba, MD   [facility] [address] [place], [place] [address] [phone]

## 2024-05-30 ENCOUNTER — Other Ambulatory Visit: Payer: Self-pay | Admitting: Urology

## 2024-05-30 DIAGNOSIS — N200 Calculus of kidney: Secondary | ICD-10-CM

## 2024-06-02 ENCOUNTER — Other Ambulatory Visit: Payer: Self-pay | Admitting: Urology

## 2024-06-02 DIAGNOSIS — N200 Calculus of kidney: Secondary | ICD-10-CM

## 2024-12-12 ENCOUNTER — Ambulatory Visit: Admitting: Urology
